# Patient Record
Sex: Male | Born: 1965 | Hispanic: Yes | Smoking: Former smoker
Health system: Southern US, Community
[De-identification: ages and names within clinical notes are randomized; demographics above are authoritative.]

## PROBLEM LIST (undated history)

## (undated) DIAGNOSIS — K859 Acute pancreatitis without necrosis or infection, unspecified: Secondary | ICD-10-CM

## (undated) DIAGNOSIS — Z720 Tobacco use: Secondary | ICD-10-CM

## (undated) DIAGNOSIS — E7800 Pure hypercholesterolemia, unspecified: Secondary | ICD-10-CM

## (undated) DIAGNOSIS — G459 Transient cerebral ischemic attack, unspecified: Secondary | ICD-10-CM

## (undated) HISTORY — PX: ABDOMINAL SURGERY: SHX537

## (undated) HISTORY — DX: Tobacco use: Z72.0

---

## 1996-09-04 HISTORY — PX: ABDOMINAL SURGERY: SHX537

## 2017-05-25 ENCOUNTER — Encounter (HOSPITAL_COMMUNITY): Payer: Self-pay | Admitting: *Deleted

## 2017-05-25 ENCOUNTER — Emergency Department (HOSPITAL_COMMUNITY): Payer: Self-pay

## 2017-05-25 ENCOUNTER — Observation Stay (HOSPITAL_COMMUNITY)
Admission: EM | Admit: 2017-05-25 | Discharge: 2017-05-27 | Disposition: A | Discharge status: Home / Self Care | Payer: Self-pay | Attending: Internal Medicine | Admitting: Internal Medicine

## 2017-05-25 DIAGNOSIS — R202 Paresthesia of skin: Secondary | ICD-10-CM | POA: Diagnosis present

## 2017-05-25 DIAGNOSIS — E876 Hypokalemia: Secondary | ICD-10-CM | POA: Insufficient documentation

## 2017-05-25 DIAGNOSIS — Z9114 Patient's other noncompliance with medication regimen: Secondary | ICD-10-CM

## 2017-05-25 DIAGNOSIS — G459 Transient cerebral ischemic attack, unspecified: Principal | ICD-10-CM | POA: Diagnosis present

## 2017-05-25 DIAGNOSIS — Z720 Tobacco use: Secondary | ICD-10-CM | POA: Diagnosis present

## 2017-05-25 DIAGNOSIS — Z8673 Personal history of transient ischemic attack (TIA), and cerebral infarction without residual deficits: Secondary | ICD-10-CM | POA: Insufficient documentation

## 2017-05-25 DIAGNOSIS — R079 Chest pain, unspecified: Secondary | ICD-10-CM | POA: Insufficient documentation

## 2017-05-25 DIAGNOSIS — G451 Carotid artery syndrome (hemispheric): Secondary | ICD-10-CM

## 2017-05-25 HISTORY — DX: Transient cerebral ischemic attack, unspecified: G45.9

## 2017-05-25 HISTORY — DX: Pure hypercholesterolemia, unspecified: E78.00

## 2017-05-25 HISTORY — DX: Acute pancreatitis without necrosis or infection, unspecified: K85.90

## 2017-05-25 LAB — DIFFERENTIAL
BASOS ABS: 0 10*3/uL (ref 0.0–0.1)
BASOS PCT: 0 %
Eosinophils Absolute: 0.4 10*3/uL (ref 0.0–0.7)
Eosinophils Relative: 5 %
LYMPHS PCT: 25 %
Lymphs Abs: 1.8 10*3/uL (ref 0.7–4.0)
MONO ABS: 0.6 10*3/uL (ref 0.1–1.0)
MONOS PCT: 9 %
Neutro Abs: 4.4 10*3/uL (ref 1.7–7.7)
Neutrophils Relative %: 61 %

## 2017-05-25 LAB — I-STAT TROPONIN, ED: TROPONIN I, POC: 0 ng/mL (ref 0.00–0.08)

## 2017-05-25 LAB — COMPREHENSIVE METABOLIC PANEL
ALK PHOS: 63 U/L (ref 38–126)
ALT: 26 U/L (ref 17–63)
AST: 22 U/L (ref 15–41)
Albumin: 4.4 g/dL (ref 3.5–5.0)
Anion gap: 10 (ref 5–15)
BUN: 12 mg/dL (ref 6–20)
CHLORIDE: 103 mmol/L (ref 101–111)
CO2: 25 mmol/L (ref 22–32)
CREATININE: 1.1 mg/dL (ref 0.61–1.24)
Calcium: 9 mg/dL (ref 8.9–10.3)
Glucose, Bld: 185 mg/dL — ABNORMAL HIGH (ref 65–99)
Potassium: 3.3 mmol/L — ABNORMAL LOW (ref 3.5–5.1)
Sodium: 138 mmol/L (ref 135–145)
Total Bilirubin: 0.4 mg/dL (ref 0.3–1.2)
Total Protein: 7.3 g/dL (ref 6.5–8.1)

## 2017-05-25 LAB — CBC
HEMATOCRIT: 42.6 % (ref 39.0–52.0)
HEMOGLOBIN: 15.4 g/dL (ref 13.0–17.0)
MCH: 32.6 pg (ref 26.0–34.0)
MCHC: 36.2 g/dL — ABNORMAL HIGH (ref 30.0–36.0)
MCV: 90.3 fL (ref 78.0–100.0)
Platelets: 184 10*3/uL (ref 150–400)
RBC: 4.72 MIL/uL (ref 4.22–5.81)
RDW: 12.3 % (ref 11.5–15.5)
WBC: 7.2 10*3/uL (ref 4.0–10.5)

## 2017-05-25 LAB — CBG MONITORING, ED: Glucose-Capillary: 186 mg/dL — ABNORMAL HIGH (ref 65–99)

## 2017-05-25 LAB — APTT: APTT: 26 s (ref 24–36)

## 2017-05-25 LAB — PROTIME-INR
INR: 0.92
Prothrombin Time: 12.3 seconds (ref 11.4–15.2)

## 2017-05-25 MED ORDER — ASPIRIN 81 MG PO CHEW
324.0000 mg | CHEWABLE_TABLET | Freq: Once | ORAL | Status: AC
  Administered 2017-05-26: 324 mg via ORAL
  Filled 2017-05-25: qty 4
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
## 2017-05-25 NOTE — ED Notes (Addendum)
Pt now reporting chest pain to the tele neurologist. Pt reporting pain in the upper left side of his chest.

## 2017-05-25 NOTE — ED Provider Notes (Signed)
[facility] DEPT Provider Note   CSN: [id number] Arrival date & time: 05/25/17  2323     History   Chief Complaint Chief Complaint  Patient presents with  . Code Stroke    HPI Nathaniel Dunlap is a 51 y.o. male.  The history is provided by the patient.  He has a history of a TIA last year. Yesterday, he had an episode of left-sided numbness and weakness which resolved after about 1.5 hours. Left-sided numbness and weakness recurred at about 6 PM, but is improving. Numbness involves face, arm, leg. He feels that he still has some numbness in his face, but weakness has improved. He has also had a left parietal headache. He was brought in by EMS as a possible code stroke.  No past medical history on file.  There are no active problems to display for this patient.   No past surgical history on file.     Home Medications    Prior to Admission medications   Not on File    Family History No family history on file.  Social History Social History  Substance Use Topics  . Smoking status: Not on file  . Smokeless tobacco: Not on file  . Alcohol use Not on file     Allergies   Patient has no allergy information on record.   Review of Systems Review of Systems  All other systems reviewed and are negative.    Physical Exam Updated Vital Signs BP (!) 158/92 (BP Location: Right Arm)   Pulse 83   Temp 99.3 F (37.4 C) (Oral)   Resp (!) 21   Ht 5\' 11"  (1.803 m)   Wt 84.4 kg (186 lb)   SpO2 96%   BMI 25.94 kg/m   Physical Exam  Nursing note and vitals reviewed.  51 year old male, resting comfortably and in no acute distress. Vital signs are significant for hypertension and borderline tachypnea. Oxygen saturation is 96%, which is normal. Head is normocephalic and atraumatic. PERRLA, EOMI. Oropharynx is clear. Neck is nontender and supple without adenopathy or JVD. There are no carotid bruits. Back is nontender and there is no CVA tenderness. Lungs are clear  without rales, wheezes, or rhonchi. Chest is nontender. Heart has regular rate and rhythm without murmur. Abdomen is soft, flat, nontender without masses or hepatosplenomegaly and peristalsis is normoactive. Extremities have no cyanosis or edema, full range of motion is present. Skin is warm and dry without rash. Neurologic: Mental status is normal, speech is normal, cranial nerves are significant for mild left central facial droop. There is no pronator drift. Grip strength, although flexion, elbow extension, hip flexion are 5/5 and symmetric. There is no extinction on double simultaneous stimulation.  ED Treatments / Results  Labs (all labs ordered are listed, but only abnormal results are displayed) Labs Reviewed  CBC - Abnormal; Notable for the following:       Result Value   MCHC 36.2 (*)    All other components within normal limits  COMPREHENSIVE METABOLIC PANEL - Abnormal; Notable for the following:    Potassium 3.3 (*)    Glucose, Bld 185 (*)    All other components within normal limits  URINALYSIS, ROUTINE W REFLEX MICROSCOPIC - Abnormal; Notable for the following:    Leukocytes, UA TRACE (*)    Bacteria, UA RARE (*)    Squamous Epithelial / LPF 0-5 (*)    All other components within normal limits  CBG MONITORING, ED - Abnormal; Notable for the  following:    Glucose-Capillary 186 (*)    All other components within normal limits  ETHANOL  PROTIME-INR  APTT  DIFFERENTIAL  RAPID URINE DRUG SCREEN, HOSP PERFORMED  I-STAT TROPONIN, ED    EKG  EKG Interpretation  Date/Time:  Friday May 25 2017 23:36:33 EDT Ventricular Rate:  83 PR Interval:    QRS Duration: 114 QT Interval:  361 QTC Calculation: 425 R Axis:   87 Text Interpretation:  Sinus rhythm Incomplete right bundle branch block ST elev, probable normal early repol pattern No old tracing to compare Confirmed by Delora Fuel ([id number]) on 05/25/2017 11:39:17 PM       Radiology Ct Head Code Stroke Wo  Contrast  Result Date: 05/25/2017 CLINICAL DATA:  Code stroke. LEFT-sided weakness, numbness and tingling. History of TIA. EXAM: CT HEAD WITHOUT CONTRAST TECHNIQUE: Contiguous axial images were obtained from the base of the skull through the vertex without intravenous contrast. COMPARISON:  None. FINDINGS: BRAIN: No intraparenchymal hemorrhage, mass effect nor midline shift. The ventricles and sulci are normal. No acute large vascular territory infarcts. No abnormal extra-axial fluid collections. Basal cisterns are patent. VASCULAR: Trace calcific atherosclerosis carotid siphons. SKULL/SOFT TISSUES: No skull fracture. No significant soft tissue swelling. ORBITS/SINUSES: The included ocular globes and orbital contents are normal.Trace paranasal sinus mucosal thickening. Mastoid air cells are well aerated. OTHER: None. ASPECTS [facility] Stroke Program Early CT Score) - Ganglionic level infarction (caudate, lentiform nuclei, internal capsule, insula, M1-M3 cortex): 7 - Supraganglionic infarction (M4-M6 cortex): 3 Total score (0-10 with 10 being normal): 10 IMPRESSION: 1. Negative CT HEAD for age. 2. ASPECTS is 10. 3. Critical Value/emergent results were called by telephone at the time of interpretation on 05/25/2017 at 11:37 pm to Dr. Delora Fuel , who verbally acknowledged these results. Electronically Signed   By: Elon Alas M.D.   On: 05/25/2017 23:38    Procedures Procedures (including critical care time) CRITICAL CARE Performed by: NLZJQ,BHALP Total critical care time: 45 minutes Critical care time was exclusive of separately billable procedures and treating other patients. Critical care was necessary to treat or prevent imminent or life-threatening deterioration. Critical care was time spent personally by me on the following activities: development of treatment plan with patient and/or surrogate as well as nursing, discussions with consultants, evaluation of patient's response to treatment,  examination of patient, obtaining history from patient or surrogate, ordering and performing treatments and interventions, ordering and review of laboratory studies, ordering and review of radiographic studies, pulse oximetry and re-evaluation of patient's condition.  Medications Ordered in ED Medications - No data to display   Initial Impression / Assessment and Plan / ED Course  I have reviewed the triage vital signs and the nursing notes.  Pertinent labs & imaging results that were available during my care of the patient were reviewed by me and considered in my medical decision making (see chart for details).  TIA versus small stroke. He is outside the treatment window for thrombolytic therapy, and has deficits which are too mild to warrant emergent perfusion studies. His prior TIA workup was in [place], and I do not have access to those records. He has no prior records and the [facility] system.  CT shows small vessel disease, no evidence of acute stroke. Tele-Neurology consult appreciated-they also note mildly asymmetric smile but no definite findings of acute stroke. There are also in agreement that he is outside the window for thrombolytic care and does not exhibit serious enough deficit to warrant perfusion studies  for possible interventional radiology management. He will need to be admitted for TIA evaluation. He is started on aspirin. Of note, he admits to not taking aspirin and medication for his cholesterol and also continues to smoke 1 pack of cigarettes a day. Importance of medication compliance and stopping smoking was stressed. Case is discussed with Dr. Darrick Meigs of [facility] who agrees to admit the patient.  Final Clinical Impressions(s) / ED Diagnoses   Final diagnoses:  Hemispheric carotid artery syndrome  Noncompliance with medication regimen  Tobacco abuse    New Prescriptions New Prescriptions   No medications on file     Delora Fuel, MD 30/14/84 [phone]

## 2017-05-25 NOTE — Progress Notes (Signed)
Er called with patient en router to er @ 2311, patient arrived @ 2326, exam finished and sent to soc at 2328, exam completed and called radiologist at 2330

## 2017-05-26 ENCOUNTER — Observation Stay (HOSPITAL_COMMUNITY): Payer: Self-pay

## 2017-05-26 ENCOUNTER — Encounter (HOSPITAL_COMMUNITY): Payer: Self-pay | Admitting: *Deleted

## 2017-05-26 ENCOUNTER — Observation Stay (HOSPITAL_BASED_OUTPATIENT_CLINIC_OR_DEPARTMENT_OTHER): Payer: Self-pay

## 2017-05-26 DIAGNOSIS — G451 Carotid artery syndrome (hemispheric): Secondary | ICD-10-CM

## 2017-05-26 DIAGNOSIS — G459 Transient cerebral ischemic attack, unspecified: Secondary | ICD-10-CM

## 2017-05-26 DIAGNOSIS — R531 Weakness: Secondary | ICD-10-CM

## 2017-05-26 DIAGNOSIS — Z9114 Patient's other noncompliance with medication regimen: Secondary | ICD-10-CM

## 2017-05-26 LAB — URINALYSIS, ROUTINE W REFLEX MICROSCOPIC
Bilirubin Urine: NEGATIVE
GLUCOSE, UA: NEGATIVE mg/dL
HGB URINE DIPSTICK: NEGATIVE
Ketones, ur: NEGATIVE mg/dL
Nitrite: NEGATIVE
Protein, ur: NEGATIVE mg/dL
SPECIFIC GRAVITY, URINE: 1.011 (ref 1.005–1.030)
pH: 6 (ref 5.0–8.0)

## 2017-05-26 LAB — LIPID PANEL
Cholesterol: 193 mg/dL (ref 0–200)
HDL: 24 mg/dL — ABNORMAL LOW (ref >40)
LDL Cholesterol: UNDETERMINED mg/dL (ref 0–99)
Total CHOL/HDL Ratio: 8 RATIO
Triglycerides: 489 mg/dL — ABNORMAL HIGH (ref <150)
VLDL: UNDETERMINED mg/dL (ref 0–40)

## 2017-05-26 LAB — ECHOCARDIOGRAM COMPLETE
Height: 71 in
Weight: 3022.95 oz

## 2017-05-26 LAB — RAPID URINE DRUG SCREEN, HOSP PERFORMED
AMPHETAMINES: NOT DETECTED
BARBITURATES: NOT DETECTED
BENZODIAZEPINES: NOT DETECTED
Cocaine: NOT DETECTED
Opiates: NOT DETECTED
Tetrahydrocannabinol: NOT DETECTED

## 2017-05-26 LAB — HEMOGLOBIN A1C
Hgb A1c MFr Bld: 6 % — ABNORMAL HIGH (ref 4.8–5.6)
Mean Plasma Glucose: 125.5 mg/dL

## 2017-05-26 LAB — ETHANOL: Alcohol, Ethyl (B): <5 mg/dL (ref <5)

## 2017-05-26 MED ORDER — ACETAMINOPHEN 160 MG/5ML PO SOLN: 650.0000 mg | ORAL | Status: DC | PRN

## 2017-05-26 MED ORDER — POTASSIUM CHLORIDE CRYS ER 20 MEQ PO TBCR
40.0000 meq | EXTENDED_RELEASE_TABLET | Freq: Once | ORAL | Status: AC
  Administered 2017-05-26: 40 meq via ORAL
  Filled 2017-05-26: qty 2

## 2017-05-26 MED ORDER — SODIUM CHLORIDE 0.9 % IV SOLN
INTRAVENOUS | Status: DC
  Administered 2017-05-26: 04:00:00 via INTRAVENOUS

## 2017-05-26 MED ORDER — ASPIRIN 300 MG RE SUPP
300.0000 mg | Freq: Every day | RECTAL | Status: DC
  Filled 2017-05-26: qty 1

## 2017-05-26 MED ORDER — ACETAMINOPHEN 325 MG PO TABS: 650.0000 mg | ORAL_TABLET | ORAL | Status: DC | PRN

## 2017-05-26 MED ORDER — ACETAMINOPHEN 650 MG RE SUPP: 650.0000 mg | RECTAL | Status: DC | PRN

## 2017-05-26 MED ORDER — SENNOSIDES-DOCUSATE SODIUM 8.6-50 MG PO TABS: 1.0000 | ORAL_TABLET | Freq: Every evening | ORAL | Status: DC | PRN

## 2017-05-26 MED ORDER — ENOXAPARIN SODIUM 40 MG/0.4ML ~~LOC~~ SOLN
40.0000 mg | SUBCUTANEOUS | Status: DC
  Administered 2017-05-26 – 2017-05-27 (×2): 40 mg via SUBCUTANEOUS
  Filled 2017-05-26 (×2): qty 0.4

## 2017-05-26 MED ORDER — ASPIRIN 325 MG PO TABS
325.0000 mg | ORAL_TABLET | Freq: Every day | ORAL | Status: DC
  Administered 2017-05-26 – 2017-05-27 (×2): 325 mg via ORAL
  Filled 2017-05-26 (×2): qty 1

## 2017-05-26 MED ORDER — STROKE: EARLY STAGES OF RECOVERY BOOK
Freq: Once | Status: DC
  Filled 2017-05-26: qty 1
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
## 2017-05-26 NOTE — Progress Notes (Signed)
Pt arrived back on the floor from MRI.

## 2017-05-26 NOTE — Progress Notes (Signed)
Stroke education booklet given to pt.

## 2017-05-26 NOTE — Progress Notes (Signed)
Spoke with MD, verbal order to discontinue stroke neuro checks and vitals. Will continue to monitor.

## 2017-05-26 NOTE — ED Notes (Signed)
Pt reports decrease in headache, decrease in numbness and tingling in his left cheek and left arm. Pt denying chest pain at this time. Pt still has small amount of left sided facial drop but is improved from when he first arrived.

## 2017-05-26 NOTE — Evaluation (Signed)
Physical Therapy Evaluation Patient Details Name: Nathaniel Dunlap MRN: [id number] DOB: 1965-09-05 Today's Date: 05/26/2017   History of Present Illness  Nathaniel Dunlap  is a 51 y.o. male, With history of TIA in the past came to hospital with episode of chest pain with left-sided numbness of face and left upper and lower extremity, which lasted for about 1.5 hours. Patient says that he had similar episode yesterday which resolved very quickly. He also complains of chest pain, though he is chest pain-free at this time.  Clinical Impression  Pt has no weakness, balance or ambulation difficulty.  Pt does not need PT services    Follow Up Recommendations No PT follow up    Equipment Recommendations  None recommended by PT    Recommendations for Other Services       Precautions / Restrictions Precautions Precautions: None Restrictions Weight Bearing Restrictions: No      Mobility  Bed Mobility Overal bed mobility: Independent                Transfers Overall transfer level: Independent                  Ambulation/Gait Ambulation/Gait assistance: Independent   Assistive device: None Gait Pattern/deviations: WFL(Within Functional Limits)   Gait velocity interpretation: at or above normal speed for age/gender    Stairs            Wheelchair Mobility    Modified Rankin (Stroke Patients Only)       Balance Overall balance assessment: Independent                                           Pertinent Vitals/Pain Pain Assessment: No/denies pain    Home Living Family/patient expects to be discharged to:: Private residence Living Arrangements: Spouse/significant other;Children Available Help at Discharge: Family Type of Home: House Home Access: Stairs to enter Entrance Stairs-Rails: Right Entrance Stairs-Number of Steps: 5 Home Layout: One level Home Equipment: None      Prior Function Level of Independence: Independent                Hand Dominance        Extremity/Trunk Assessment        Lower Extremity Assessment Lower Extremity Assessment: Overall WFL for tasks assessed       Communication   Communication: No difficulties  Cognition Arousal/Alertness: Awake/alert Behavior During Therapy: WFL for tasks assessed/performed Overall Cognitive Status: Within Functional Limits for tasks assessed                                        General Comments      Exercises     Assessment/Plan    PT Assessment Patent does not need any further PT services  PT Problem List         PT Treatment Interventions      PT Goals (Current goals can be found in the Care Plan section)       Frequency     Barriers to discharge        Co-evaluation               AM-PAC PT "6 Clicks" Daily Activity  Outcome Measure Difficulty turning over in bed (including adjusting bedclothes, sheets and blankets)?: None Difficulty moving  from lying on back to sitting on the side of the bed? : None Difficulty sitting down on and standing up from a chair with arms (e.g., wheelchair, bedside commode, etc,.)?: None Help needed moving to and from a bed to chair (including a wheelchair)?: None Help needed walking in hospital room?: None Help needed climbing 3-5 steps with a railing? : None 6 Click Score: 24    End of Session Equipment Utilized During Treatment: Gait belt Activity Tolerance: Patient tolerated treatment well Patient left: in bed Nurse Communication: Mobility status PT Visit Diagnosis: Unsteadiness on feet (R26.81)    Time: 5537-4827 PT Time Calculation (min) (ACUTE ONLY): 28 min   Charges:         PT G Codes:   PT G-Codes **NOT FOR INPATIENT CLASS** Functional Assessment Tool Used: AM-PAC 6 Clicks Basic Mobility Functional Limitation: Mobility: Walking and moving around Mobility: Walking and Moving Around Current Status (M7867): 0 percent impaired, limited or  restricted Mobility: Walking and Moving Around Goal Status (J4492): 0 percent impaired, limited or restricted Mobility: Walking and Moving Around Discharge Status (E1007): 0 percent impaired, limited or restricted      05/26/2017, 11:39 AM

## 2017-05-26 NOTE — Progress Notes (Signed)
Patient admitted to the hospital earlier this morning by Dr. Darrick Meigs  Patient seen and examined. He had transient numbness of his arm and leg as well as his face. He's had 2 episodes in the past 2 days, each lasting 1-2 hours. His similar episode earlier this year. He also complains of left-sided neck pain that radiates down his chest. Question if he has cervical radiculopathy. MRI of the brain/MRA of the head was unremarkable. Carotid Dopplers and echocardiogram were also unrevealing. We'll perform MRI of the C-spine for further evaluation.  Nasiah Lehenbauer

## 2017-05-26 NOTE — Progress Notes (Signed)
Pt being transported down to MRI by transport staff.

## 2017-05-26 NOTE — ED Notes (Signed)
Pt sleeping at this time. Pt being transported upstairs.

## 2017-05-26 NOTE — Progress Notes (Signed)
  Echocardiogram 2D Echocardiogram has been performed.  Tresa Res 05/26/2017, 11:09 AM

## 2017-05-27 ENCOUNTER — Observation Stay (HOSPITAL_COMMUNITY): Payer: Self-pay

## 2017-05-27 DIAGNOSIS — Z720 Tobacco use: Secondary | ICD-10-CM | POA: Diagnosis present

## 2017-05-27 DIAGNOSIS — R202 Paresthesia of skin: Secondary | ICD-10-CM | POA: Diagnosis present

## 2017-05-27 LAB — HIV ANTIBODY (ROUTINE TESTING W REFLEX): HIV Screen 4th Generation wRfx: NONREACTIVE

## 2017-05-27 NOTE — Progress Notes (Signed)
Pr back on floor from MRI.

## 2017-05-27 NOTE — Discharge Summary (Signed)
Physician Discharge Summary  Nathaniel Dunlap GEZ:[id number] DOB: [birth year] DOA: 05/25/2017  PCP: Patient, No Pcp Per  Admit date: 05/25/2017 Discharge date: 05/27/2017  Admitted From:Home Disposition:  Home  Recommendations for Outpatient Follow-up:  1. Follow up with PCP in 1-2 weeks 2. Please obtain BMP/CBC in one week 3. Patient has been referred for outpatient neurology. Will likely benefit from outpatient EEG 4. No driving until cleared by neurology 5. He has also been referred to neurosurgery to evaluate bilateral cervical neuroforaminal stenosis  Home Health: Equipment/Devices:  Discharge Condition: Stable CODE STATUS: Full code Diet recommendation: Heart Healthy  Brief/Interim Summary: 51 -year-old male who is admitted to the hospital with transient numbness and paresthesias of his left face, arm and leg. He reported symptoms lasted approximately 1-2 hours. This occurred on the day of admission. He also had a similar episode on the day before admission, lasting 1-2 hours and resolving spontaneously. He does not have any symptoms at this time. MRI of the brain/MRA of the head/carotid Doppler/echocardiogram were all unrevealing. Since he also had neck pain radiating down his chest with the symptoms, MRI of the C-spine was also obtained. This did show nonspecific patchy marrow edema in the C5, C6, and C7 vertebral bodies. No suspicious signal changes in the intervening discs and no associated epidural or paraspinal inflammation. There was also mention of C5-C6 and C6-C7 disc and endplate degeneration. No cervical spinal stenosis, moderate to severe right greater than left C6 and left C7 neural foraminal stenosis. Images were reviewed with neurosurgery who felt that foraminal stenosis may be significant, leading to neck pain, but likely not causing his numbness and paresthesias. They have recommended that the patient follow-up with neurosurgery to discuss further management of stenosis. Case  was also reviewed with on-call neurology who did not feel the MRI findings with significant. They did recommend outpatient neurology follow-up further investigation regarding transient paresthesia/numbness. Patient may benefit from outpatient EEG to rule out any partial seizures. He is advised that he cannot drive until he is cleared by neurology. Outpatient referral for neurology has been made. Patient is otherwise stable for discharge home today.  Discharge Diagnoses:  Principal Problem:   Paresthesia of left arm and leg Active Problems:   Tobacco use    Discharge Instructions  Discharge Instructions    Ambulatory referral to Neurology    Complete by:  As directed    An appointment is requested in approximately: 1 week Patient does not have any preference of provider. Please schedule with next available provider   Diet - low sodium heart healthy    Complete by:  As directed    Driving Restrictions    Complete by:  As directed    No driving until cleared by neurology   Increase activity slowly    Complete by:  As directed      Allergies as of 05/27/2017   No Known Allergies     Medication List    TAKE these medications   aspirin 81 MG chewable tablet Chew 81 mg by mouth daily.            Discharge Care Instructions        Start     Ordered   05/27/17 0000  Ambulatory referral to Neurology    Comments:  An appointment is requested in approximately: 1 week Patient does not have any preference of provider. Please schedule with next available provider   05/27/17 1551   05/27/17 0000  Increase activity slowly  05/27/17 1551   05/27/17 0000  Diet - low sodium heart healthy     05/27/17 1551   05/27/17 0000  Driving Restrictions    Comments:  No driving until cleared by neurology   05/27/17 1551      No Known Allergies  Consultations:     Procedures/Studies: Dg Chest 2 View  Result Date: 05/26/2017 CLINICAL DATA:  Recent TIAs EXAM: CHEST  2 VIEW  COMPARISON:  None. FINDINGS: Cardiac shadow is within normal limits. The lungs are well aerated bilaterally with mild bronchitic markings. No focal confluent infiltrate is seen. No sizable effusion is noted. No bony abnormality is seen. IMPRESSION: Mild bronchitic changes without acute focal infiltrate. Electronically Signed   By: Inez Catalina M.D.   On: 05/26/2017 10:04   Mr Brain Wo Contrast  Result Date: 05/26/2017 CLINICAL DATA:  51 year old male code stroke presentation, left side weakness numbness and tingling. Initial encounter. EXAM: MRI HEAD WITHOUT CONTRAST MRA HEAD WITHOUT CONTRAST TECHNIQUE: Multiplanar, multiecho pulse sequences of the brain and surrounding structures were obtained without intravenous contrast. Angiographic images of the head were obtained using MRA technique without contrast. COMPARISON:  CT head without contrast 05/25/2017. FINDINGS: MRI HEAD FINDINGS Brain: No restricted diffusion to suggest acute infarction. No midline shift, mass effect, evidence of mass lesion, ventriculomegaly, extra-axial collection or acute intracranial hemorrhage. Cervicomedullary junction and pituitary are within normal limits. Pearline Cables and white matter signal is within normal limits throughout the brain. No encephalomalacia or chronic cerebral blood products identified. Vascular: Major intracranial vascular flow voids are preserved. Skull and upper cervical spine: Negative. Visualized bone marrow signal is within normal limits. Sinuses/Orbits: Normal orbits soft tissues. Mild maxillary sinus mucosal thickening. Small volume retained secretions in the right nasal cavity and nasopharynx. Incidental midline proteinaceous Tornwaldt cyst (normal variant). Other: Visible internal auditory structures appear normal. Mastoids are clear. Scalp and face soft tissues appear negative. MRA HEAD FINDINGS Antegrade flow in the posterior circulation. Mildly dominant distal left vertebral artery. Patent PICA origins. No  distal vertebral stenosis. Patent vertebrobasilar junction and basilar artery without stenosis. AICA and SCA origins are patent. Normal PCA origins. Normal right posterior communicating artery, the left is diminutive or absent. Bilateral PCA branches are normal. Antegrade flow in both ICA siphons. Mild siphon irregularity. No siphon stenosis. Normal ophthalmic and right posterior communicating artery origins. Normal MCA and ACA origins. Visible ACA branches are within normal limits. Both MCA M1 segments and MCA bifurcations are patent and appear normal. Visible left and right MCA branches are within normal limits. IMPRESSION: 1. Normal noncontrast MRI appearance of the brain. 2.  Negative intracranial MRA. Electronically Signed   By: Genevie Ann M.D.   On: 05/26/2017 11:13   Mr Cervical Spine Wo Contrast  Result Date: 05/27/2017 CLINICAL DATA:  51 year old male with cervical neck pain, left side numbness and weakness. Code stroke presentation 2 days ago. No known injury. EXAM: MRI CERVICAL SPINE WITHOUT CONTRAST TECHNIQUE: Multiplanar, multisequence MR imaging of the cervical spine was performed. No intravenous contrast was administered. COMPARISON:  Brain MRI and intracranial MRA 05/26/2017. FINDINGS: Study is intermittently degraded by motion artifact despite repeated imaging attempts. Alignment: Preserved cervical lordosis.  No spondylolisthesis. Vertebrae: Patchy and confluent vertebral body marrow edema in the C5, C6, and C7 bodies. No posterior element involvement. The intervening disc spaces appear relatively normal. No associated prevertebral or paraspinal inflammation. Elsewhere bone marrow signal is within normal limits. No other acute osseous abnormality identified. Cord: Detail of the spinal cord is  degraded at some levels but there is no definite cord signal abnormality. No cord expansion. Posterior Fossa, vertebral arteries, paraspinal tissues: Stable and negative visualized brain parenchyma. Preserved  major vascular flow voids in the neck. No paraspinal or other neck soft tissue inflammation identified. Disc levels: C2-C3:  Mild facet hypertrophy. C3-C4:  Minimal disc bulge and endplate spurring.  No stenosis. C4-C5:  Negative disc.  Minimal endplate spurring.  No stenosis. C5-C6: Mild disc space loss. Mild circumferential disc bulge and endplate spurring. No spinal stenosis. Right greater than left foraminal disc and/or endplate involvement. Moderate to severe left and severe right C6 neural foraminal stenosis. C6-C7: Mild to moderate disc space loss with disc desiccation. Circumferential disc bulge and endplate spurring. No spinal stenosis. Left greater than right foraminal disc and/or endplate involvement. Moderate to severe left and mild right C7 neural foraminal stenosis. C7-T1:  Mild facet hypertrophy. No upper thoracic spinal or foraminal stenosis. IMPRESSION: 1. Nonspecific patchy marrow edema in the C5, C6, and C7 vertebral bodies. No suspicious signal changes in the intervening discs, and no associated epidural or paraspinal inflammation. Burtis Junes therefore this is degenerative in nature (see #2), but consider screening the patient for bacteremia - especially if there are other clinical signs/symptoms of infection. 2. C5-C6 and C6-C7 disc and endplate degeneration. No cervical spinal stenosis, but moderate to severe right greater than left C6 and left C7 neural foraminal stenosis. Electronically Signed   By: Genevie Ann M.D.   On: 05/27/2017 10:39   US Carotid Bilateral (at [facility] Only)  Result Date: 05/26/2017 CLINICAL DATA:  TIA with left-sided numbness and weakness. Headache and left-sided neck pain. Tobacco use. EXAM: BILATERAL CAROTID DUPLEX ULTRASOUND TECHNIQUE: Pearline Cables scale imaging, color Doppler and duplex ultrasound were performed of bilateral carotid and vertebral arteries in the neck. COMPARISON:  None. FINDINGS: Criteria: Quantification of carotid stenosis is based on velocity parameters  that correlate the residual internal carotid diameter with NASCET-based stenosis levels, using the diameter of the distal internal carotid lumen as the denominator for stenosis measurement. The following velocity measurements were obtained: RIGHT ICA:  86/37 cm/sec CCA:  174/08 cm/sec SYSTOLIC ICA/CCA RATIO:  0.7 DIASTOLIC ICA/CCA RATIO:  1.1 ECA:  126 cm/sec LEFT ICA:  90/35 cm/sec CCA:  14/48 cm/sec SYSTOLIC ICA/CCA RATIO:  0.9 DIASTOLIC ICA/CCA RATIO:  1.6 ECA:  128 cm/sec RIGHT CAROTID ARTERY: No evidence of focal plaque. Velocities and waveforms are normal. No evidence of right-sided carotid stenosis. RIGHT VERTEBRAL ARTERY: Antegrade flow with normal waveform and velocity. LEFT CAROTID ARTERY: No evidence of focal plaque. Velocities and waveforms are within normal limits. No evidence of left-sided carotid stenosis. LEFT VERTEBRAL ARTERY: Antegrade flow with normal waveform and velocity. IMPRESSION: Normal carotid duplex ultrasound demonstrating no evidence of atherosclerotic plaque or carotid stenosis in the neck bilaterally. Electronically Signed   By: Aletta Edouard M.D.   On: 05/26/2017 11:33   Mr Jodene Nam Head/brain JE Cm  Result Date: 05/26/2017 CLINICAL DATA:  51 year old male code stroke presentation, left side weakness numbness and tingling. Initial encounter. EXAM: MRI HEAD WITHOUT CONTRAST MRA HEAD WITHOUT CONTRAST TECHNIQUE: Multiplanar, multiecho pulse sequences of the brain and surrounding structures were obtained without intravenous contrast. Angiographic images of the head were obtained using MRA technique without contrast. COMPARISON:  CT head without contrast 05/25/2017. FINDINGS: MRI HEAD FINDINGS Brain: No restricted diffusion to suggest acute infarction. No midline shift, mass effect, evidence of mass lesion, ventriculomegaly, extra-axial collection or acute intracranial hemorrhage. Cervicomedullary junction and pituitary are  within normal limits. Pearline Cables and white matter signal is within  normal limits throughout the brain. No encephalomalacia or chronic cerebral blood products identified. Vascular: Major intracranial vascular flow voids are preserved. Skull and upper cervical spine: Negative. Visualized bone marrow signal is within normal limits. Sinuses/Orbits: Normal orbits soft tissues. Mild maxillary sinus mucosal thickening. Small volume retained secretions in the right nasal cavity and nasopharynx. Incidental midline proteinaceous Tornwaldt cyst (normal variant). Other: Visible internal auditory structures appear normal. Mastoids are clear. Scalp and face soft tissues appear negative. MRA HEAD FINDINGS Antegrade flow in the posterior circulation. Mildly dominant distal left vertebral artery. Patent PICA origins. No distal vertebral stenosis. Patent vertebrobasilar junction and basilar artery without stenosis. AICA and SCA origins are patent. Normal PCA origins. Normal right posterior communicating artery, the left is diminutive or absent. Bilateral PCA branches are normal. Antegrade flow in both ICA siphons. Mild siphon irregularity. No siphon stenosis. Normal ophthalmic and right posterior communicating artery origins. Normal MCA and ACA origins. Visible ACA branches are within normal limits. Both MCA M1 segments and MCA bifurcations are patent and appear normal. Visible left and right MCA branches are within normal limits. IMPRESSION: 1. Normal noncontrast MRI appearance of the brain. 2.  Negative intracranial MRA. Electronically Signed   By: Genevie Ann M.D.   On: 05/26/2017 11:13   Ct Head Code Stroke Wo Contrast  Result Date: 05/25/2017 CLINICAL DATA:  Code stroke. LEFT-sided weakness, numbness and tingling. History of TIA. EXAM: CT HEAD WITHOUT CONTRAST TECHNIQUE: Contiguous axial images were obtained from the base of the skull through the vertex without intravenous contrast. COMPARISON:  None. FINDINGS: BRAIN: No intraparenchymal hemorrhage, mass effect nor midline shift. The ventricles  and sulci are normal. No acute large vascular territory infarcts. No abnormal extra-axial fluid collections. Basal cisterns are patent. VASCULAR: Trace calcific atherosclerosis carotid siphons. SKULL/SOFT TISSUES: No skull fracture. No significant soft tissue swelling. ORBITS/SINUSES: The included ocular globes and orbital contents are normal.Trace paranasal sinus mucosal thickening. Mastoid air cells are well aerated. OTHER: None. ASPECTS [facility] Stroke Program Early CT Score) - Ganglionic level infarction (caudate, lentiform nuclei, internal capsule, insula, M1-M3 cortex): 7 - Supraganglionic infarction (M4-M6 cortex): 3 Total score (0-10 with 10 being normal): 10 IMPRESSION: 1. Negative CT HEAD for age. 2. ASPECTS is 10. 3. Critical Value/emergent results were called by telephone at the time of interpretation on 05/25/2017 at 11:37 pm to Dr. Delora Fuel , who verbally acknowledged these results. Electronically Signed   By: Elon Alas M.D.   On: 05/25/2017 23:38    Echo:- Mild LVH with LVEF 60-65% and indeterminate diastolic function.   Trivial mitral regurgitation. Trivial tricuspid regurgitation. No   obvious PFO or ASD.   Subjective: No further numbness or paresthesia.  Discharge Exam: Vitals:   05/26/17 2000 05/27/17 0539  BP: 114/64 114/76  Pulse: 77 69  Resp: 16 16  Temp: 98.7 F (37.1 C) 97.9 F (36.6 C)  SpO2: 97% 96%   Vitals:   05/26/17 1407 05/26/17 1534 05/26/17 2000 05/27/17 0539  BP:  130/85 114/64 114/76  Pulse:   77 69  Resp:  16 16 16   Temp:  98.3 F (36.8 C) 98.7 F (37.1 C) 97.9 F (36.6 C)  TempSrc:  Oral Oral Oral  SpO2: 94% 97% 97% 96%  Weight:      Height:        General: Pt is alert, awake, not in acute distress Cardiovascular: RRR, S1/S2 +, no rubs, no gallops Respiratory: CTA  bilaterally, no wheezing, no rhonchi Abdominal: Soft, NT, ND, bowel sounds + Extremities: no edema, no cyanosis    The results of significant diagnostics from this  hospitalization (including imaging, microbiology, ancillary and laboratory) are listed below for reference.     Microbiology: No results found for this or any previous visit (from the past 240 hour(s)).   Labs: BNP (last 3 results) No results for input(s): BNP in the last 8760 hours. Basic Metabolic Panel:  Recent Labs Lab 05/25/17 2326  NA 138  K 3.3*  CL 103  CO2 25  GLUCOSE 185*  BUN 12  CREATININE 1.10  CALCIUM 9.0   Liver Function Tests:  Recent Labs Lab 05/25/17 2326  AST 22  ALT 26  ALKPHOS 63  BILITOT 0.4  PROT 7.3  ALBUMIN 4.4   No results for input(s): LIPASE, AMYLASE in the last 168 hours. No results for input(s): AMMONIA in the last 168 hours. CBC:  Recent Labs Lab 05/25/17 2326  WBC 7.2  NEUTROABS 4.4  HGB 15.4  HCT 42.6  MCV 90.3  PLT 184   Cardiac Enzymes: No results for input(s): CKTOTAL, CKMB, CKMBINDEX, TROPONINI in the last 168 hours. BNP: Invalid input(s): POCBNP CBG:  Recent Labs Lab 05/25/17 2347  GLUCAP 186*   D-Dimer No results for input(s): DDIMER in the last 72 hours. Hgb A1c  Recent Labs  05/26/17 0620  HGBA1C 6.0*   Lipid Profile  Recent Labs  05/26/17 0620  CHOL 193  HDL 24*  LDLCALC UNABLE TO CALCULATE IF TRIGLYCERIDE OVER 400 mg/dL  TRIG 489*  CHOLHDL 8.0   Thyroid function studies No results for input(s): TSH, T4TOTAL, T3FREE, THYROIDAB in the last 72 hours.  Invalid input(s): FREET3 Anemia work up No results for input(s): VITAMINB12, FOLATE, FERRITIN, TIBC, IRON, RETICCTPCT in the last 72 hours. Urinalysis    Component Value Date/Time   COLORURINE YELLOW 05/25/2017 2325   APPEARANCEUR CLEAR 05/25/2017 2325   LABSPEC 1.011 05/25/2017 2325   PHURINE 6.0 05/25/2017 2325   GLUCOSEU NEGATIVE 05/25/2017 2325   HGBUR NEGATIVE 05/25/2017 2325   BILIRUBINUR NEGATIVE 05/25/2017 2325   KETONESUR NEGATIVE 05/25/2017 2325   PROTEINUR NEGATIVE 05/25/2017 2325   NITRITE NEGATIVE 05/25/2017 2325    LEUKOCYTESUR TRACE (A) 05/25/2017 2325   Sepsis Labs Invalid input(s): PROCALCITONIN,  WBC,  LACTICIDVEN Microbiology No results found for this or any previous visit (from the past 240 hour(s)).   Time coordinating discharge: Over 30 minutes  SIGNED:   Kathie Dike, MD  [facility] 05/27/2017, 6:43 PM Pager   If 7PM-7AM, please contact night-coverage [email or web address] Password [email or web address]

## 2017-05-27 NOTE — Progress Notes (Signed)
IV's removed, WNL. D/C instructions given to pt and family. Verbalized understanding. Pt ambulatory, family at bedside to transport home.

## 2017-05-27 NOTE — Progress Notes (Signed)
Pt transported down to radiology via transport staff.
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
## 2017-06-08 ENCOUNTER — Ambulatory Visit (INDEPENDENT_AMBULATORY_CARE_PROVIDER_SITE_OTHER): Payer: Self-pay | Admitting: Neurology

## 2017-06-08 ENCOUNTER — Encounter: Payer: Self-pay | Admitting: Neurology

## 2017-06-08 VITALS — BP 124/72 | HR 79 | Ht 71.0 in | Wt 190.0 lb

## 2017-06-08 DIAGNOSIS — E782 Mixed hyperlipidemia: Secondary | ICD-10-CM

## 2017-06-08 DIAGNOSIS — M542 Cervicalgia: Secondary | ICD-10-CM

## 2017-06-08 DIAGNOSIS — G43109 Migraine with aura, not intractable, without status migrainosus: Secondary | ICD-10-CM

## 2017-06-08 DIAGNOSIS — Z720 Tobacco use: Secondary | ICD-10-CM

## 2017-06-08 MED ORDER — CYCLOBENZAPRINE HCL 5 MG PO TABS: 5.0000 mg | ORAL_TABLET | Freq: Two times a day (BID) | ORAL | 1 refills | Status: DC | PRN

## 2017-06-08 MED ORDER — SIMVASTATIN 20 MG PO TABS: 20.0000 mg | ORAL_TABLET | Freq: Every day | ORAL | 5 refills | Status: DC

## 2017-06-08 NOTE — Patient Instructions (Addendum)
Start flexeril 5mg  twice daily as needed for neck pain and discomfort.  DO NOT TAKE WHEN DRIVING  Start simvastatin 20mg  daily  If you develop headache, take Aleve or ibuprofen for your pain  Limit all tylenol, ibuprofen, and Aleve to twice per week  You are cleared to resume driving  If you develop any new symptom, we may consider EEG testing  Stop smoking  Establish care with a primary care doctor  Return to clinic in 4 months

## 2017-10-10 ENCOUNTER — Ambulatory Visit: Payer: Self-pay | Admitting: Neurology

## 2018-04-24 ENCOUNTER — Other Ambulatory Visit: Payer: Self-pay

## 2018-04-24 ENCOUNTER — Emergency Department (HOSPITAL_COMMUNITY): Payer: No Typology Code available for payment source

## 2018-04-24 ENCOUNTER — Emergency Department (HOSPITAL_COMMUNITY)
Admission: EM | Admit: 2018-04-24 | Discharge: 2018-04-24 | Disposition: A | Discharge status: Home / Self Care | Payer: No Typology Code available for payment source | Attending: Emergency Medicine | Admitting: Emergency Medicine

## 2018-04-24 ENCOUNTER — Encounter (HOSPITAL_COMMUNITY): Payer: Self-pay | Admitting: Emergency Medicine

## 2018-04-24 DIAGNOSIS — Z7982 Long term (current) use of aspirin: Secondary | ICD-10-CM | POA: Insufficient documentation

## 2018-04-24 DIAGNOSIS — Z87891 Personal history of nicotine dependence: Secondary | ICD-10-CM | POA: Insufficient documentation

## 2018-04-24 DIAGNOSIS — R072 Precordial pain: Secondary | ICD-10-CM

## 2018-04-24 DIAGNOSIS — S39012A Strain of muscle, fascia and tendon of lower back, initial encounter: Secondary | ICD-10-CM

## 2018-04-24 DIAGNOSIS — S161XXA Strain of muscle, fascia and tendon at neck level, initial encounter: Secondary | ICD-10-CM | POA: Insufficient documentation

## 2018-04-24 DIAGNOSIS — Z79899 Other long term (current) drug therapy: Secondary | ICD-10-CM | POA: Diagnosis not present

## 2018-04-24 DIAGNOSIS — Y92410 Unspecified street and highway as the place of occurrence of the external cause: Secondary | ICD-10-CM | POA: Insufficient documentation

## 2018-04-24 DIAGNOSIS — Y999 Unspecified external cause status: Secondary | ICD-10-CM | POA: Insufficient documentation

## 2018-04-24 DIAGNOSIS — Y939 Activity, unspecified: Secondary | ICD-10-CM | POA: Diagnosis not present

## 2018-04-24 DIAGNOSIS — Z8673 Personal history of transient ischemic attack (TIA), and cerebral infarction without residual deficits: Secondary | ICD-10-CM | POA: Diagnosis not present

## 2018-04-24 DIAGNOSIS — R51 Headache: Secondary | ICD-10-CM | POA: Insufficient documentation

## 2018-04-24 DIAGNOSIS — S199XXA Unspecified injury of neck, initial encounter: Secondary | ICD-10-CM | POA: Diagnosis present

## 2018-04-24 LAB — BASIC METABOLIC PANEL
Anion gap: 9 (ref 5–15)
BUN: 12 mg/dL (ref 6–20)
CHLORIDE: 107 mmol/L (ref 98–111)
CO2: 24 mmol/L (ref 22–32)
CREATININE: 1.09 mg/dL (ref 0.61–1.24)
Calcium: 9.1 mg/dL (ref 8.9–10.3)
GFR calc Af Amer: >60 mL/min (ref >60)
GFR calc non Af Amer: >60 mL/min (ref >60)
Glucose, Bld: 109 mg/dL — ABNORMAL HIGH (ref 70–99)
POTASSIUM: 4 mmol/L (ref 3.5–5.1)
SODIUM: 140 mmol/L (ref 135–145)

## 2018-04-24 LAB — CBC WITH DIFFERENTIAL/PLATELET
Basophils Absolute: 0 10*3/uL (ref 0.0–0.1)
Basophils Relative: 1 %
Eosinophils Absolute: 0.4 10*3/uL (ref 0.0–0.7)
Eosinophils Relative: 7 %
HEMATOCRIT: 40.2 % (ref 39.0–52.0)
Hemoglobin: 13.6 g/dL (ref 13.0–17.0)
Lymphocytes Relative: 34 %
Lymphs Abs: 1.7 10*3/uL (ref 0.7–4.0)
MCH: 30.8 pg (ref 26.0–34.0)
MCHC: 33.8 g/dL (ref 30.0–36.0)
MCV: 91.2 fL (ref 78.0–100.0)
MONO ABS: 0.5 10*3/uL (ref 0.1–1.0)
Monocytes Relative: 10 %
Neutro Abs: 2.5 10*3/uL (ref 1.7–7.7)
Neutrophils Relative %: 48 %
Platelets: 180 10*3/uL (ref 150–400)
RBC: 4.41 MIL/uL (ref 4.22–5.81)
RDW: 12.1 % (ref 11.5–15.5)
WBC: 5.1 10*3/uL (ref 4.0–10.5)

## 2018-04-24 LAB — TROPONIN I

## 2018-04-24 MED ORDER — HYDROCODONE-ACETAMINOPHEN 5-325 MG PO TABS
1.0000 | ORAL_TABLET | Freq: Once | ORAL | Status: AC
  Administered 2018-04-24: 1 via ORAL
  Filled 2018-04-24: qty 1

## 2018-04-24 MED ORDER — TRAMADOL HCL 50 MG PO TABS: 50.0000 mg | ORAL_TABLET | Freq: Four times a day (QID) | ORAL | 0 refills | Status: DC | PRN

## 2018-04-24 MED ORDER — CYCLOBENZAPRINE HCL 10 MG PO TABS: 10.0000 mg | ORAL_TABLET | Freq: Three times a day (TID) | ORAL | 0 refills | Status: DC | PRN

## 2018-04-24 MED ORDER — CYCLOBENZAPRINE HCL 10 MG PO TABS
10.0000 mg | ORAL_TABLET | Freq: Once | ORAL | Status: AC
  Administered 2018-04-24: 10 mg via ORAL
  Filled 2018-04-24: qty 1
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
## 2018-04-24 NOTE — Discharge Instructions (Addendum)
Apply ice packs on and off to your neck and back.  Follow-up with your primary doctor or the clinic listed for recheck.
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
## 2019-02-10 ENCOUNTER — Other Ambulatory Visit: Payer: Self-pay

## 2019-02-10 ENCOUNTER — Other Ambulatory Visit: Payer: Self-pay | Admitting: Internal Medicine

## 2019-02-10 DIAGNOSIS — Z20822 Contact with and (suspected) exposure to covid-19: Secondary | ICD-10-CM

## 2019-02-14 LAB — NOVEL CORONAVIRUS, NAA: SARS-CoV-2, NAA: NOT DETECTED

## 2019-08-06 ENCOUNTER — Other Ambulatory Visit: Payer: Self-pay

## 2019-08-06 DIAGNOSIS — Z20822 Contact with and (suspected) exposure to covid-19: Secondary | ICD-10-CM

## 2019-08-09 LAB — NOVEL CORONAVIRUS, NAA: SARS-CoV-2, NAA: NOT DETECTED

## 2019-09-04 ENCOUNTER — Ambulatory Visit: Payer: Self-pay | Attending: Internal Medicine

## 2019-09-04 ENCOUNTER — Other Ambulatory Visit: Payer: Self-pay

## 2019-09-04 DIAGNOSIS — Z20822 Contact with and (suspected) exposure to covid-19: Secondary | ICD-10-CM

## 2019-09-06 LAB — SPECIMEN STATUS REPORT

## 2019-09-06 LAB — NOVEL CORONAVIRUS, NAA: SARS-CoV-2, NAA: NOT DETECTED

## 2020-03-18 ENCOUNTER — Other Ambulatory Visit: Payer: Self-pay

## 2020-03-18 ENCOUNTER — Encounter (HOSPITAL_COMMUNITY): Payer: Self-pay

## 2020-03-18 ENCOUNTER — Emergency Department (HOSPITAL_COMMUNITY)
Admission: EM | Admit: 2020-03-18 | Discharge: 2020-03-18 | Disposition: A | Discharge status: Home / Self Care | Payer: BC Managed Care – PPO | Attending: Emergency Medicine | Admitting: Emergency Medicine

## 2020-03-18 ENCOUNTER — Emergency Department (HOSPITAL_COMMUNITY): Payer: BC Managed Care – PPO

## 2020-03-18 DIAGNOSIS — R1013 Epigastric pain: Secondary | ICD-10-CM | POA: Insufficient documentation

## 2020-03-18 DIAGNOSIS — F17210 Nicotine dependence, cigarettes, uncomplicated: Secondary | ICD-10-CM | POA: Diagnosis not present

## 2020-03-18 LAB — URINALYSIS, ROUTINE W REFLEX MICROSCOPIC
Bacteria, UA: NONE SEEN
Bilirubin Urine: NEGATIVE
Glucose, UA: NEGATIVE mg/dL
Hgb urine dipstick: NEGATIVE
Ketones, ur: NEGATIVE mg/dL
Nitrite: NEGATIVE
Protein, ur: NEGATIVE mg/dL
Specific Gravity, Urine: 1.02 (ref 1.005–1.030)
pH: 5 (ref 5.0–8.0)

## 2020-03-18 LAB — CBC WITH DIFFERENTIAL/PLATELET
Abs Immature Granulocytes: 0.03 10*3/uL (ref 0.00–0.07)
Basophils Absolute: 0 10*3/uL (ref 0.0–0.1)
Basophils Relative: 1 %
Eosinophils Absolute: 0.4 10*3/uL (ref 0.0–0.5)
Eosinophils Relative: 7 %
HCT: 42.7 % (ref 39.0–52.0)
Hemoglobin: 14.4 g/dL (ref 13.0–17.0)
Immature Granulocytes: 1 %
Lymphocytes Relative: 27 %
Lymphs Abs: 1.4 10*3/uL (ref 0.7–4.0)
MCH: 32.6 pg (ref 26.0–34.0)
MCHC: 33.7 g/dL (ref 30.0–36.0)
MCV: 96.6 fL (ref 80.0–100.0)
Monocytes Absolute: 0.4 10*3/uL (ref 0.1–1.0)
Monocytes Relative: 8 %
Neutro Abs: 3 10*3/uL (ref 1.7–7.7)
Neutrophils Relative %: 56 %
Platelets: 203 10*3/uL (ref 150–400)
RBC: 4.42 MIL/uL (ref 4.22–5.81)
RDW: 12.5 % (ref 11.5–15.5)
WBC: 5.3 10*3/uL (ref 4.0–10.5)
nRBC: 0 % (ref 0.0–0.2)

## 2020-03-18 LAB — COMPREHENSIVE METABOLIC PANEL
ALT: 25 U/L (ref 0–44)
AST: 21 U/L (ref 15–41)
Albumin: 4.4 g/dL (ref 3.5–5.0)
Alkaline Phosphatase: 49 U/L (ref 38–126)
Anion gap: 9 (ref 5–15)
BUN: 20 mg/dL (ref 6–20)
CO2: 26 mmol/L (ref 22–32)
Calcium: 9.3 mg/dL (ref 8.9–10.3)
Chloride: 104 mmol/L (ref 98–111)
Creatinine, Ser: 0.97 mg/dL (ref 0.61–1.24)
GFR calc Af Amer: >60 mL/min (ref >60)
GFR calc non Af Amer: >60 mL/min (ref >60)
Glucose, Bld: 111 mg/dL — ABNORMAL HIGH (ref 70–99)
Potassium: 4.4 mmol/L (ref 3.5–5.1)
Sodium: 139 mmol/L (ref 135–145)
Total Bilirubin: 0.6 mg/dL (ref 0.3–1.2)
Total Protein: 7.4 g/dL (ref 6.5–8.1)

## 2020-03-18 LAB — LIPASE, BLOOD: Lipase: 29 U/L (ref 11–51)

## 2020-03-18 LAB — TROPONIN I (HIGH SENSITIVITY): Troponin I (High Sensitivity): 3 ng/L (ref <18)

## 2020-03-18 MED ORDER — MORPHINE SULFATE (PF) 4 MG/ML IV SOLN
4.0000 mg | Freq: Once | INTRAVENOUS | Status: AC
  Administered 2020-03-18: 4 mg via INTRAVENOUS
  Filled 2020-03-18: qty 1

## 2020-03-18 MED ORDER — ONDANSETRON HCL 4 MG/2ML IJ SOLN
4.0000 mg | Freq: Once | INTRAMUSCULAR | Status: AC
  Administered 2020-03-18: 4 mg via INTRAVENOUS
  Filled 2020-03-18: qty 2

## 2020-03-18 MED ORDER — FAMOTIDINE 40 MG/5ML PO SUSR
20.0000 mg | Freq: Every day | ORAL | Status: DC
  Administered 2020-03-18: 20 mg via ORAL
  Filled 2020-03-18 (×2): qty 2.5

## 2020-03-18 MED ORDER — FAMOTIDINE 20 MG PO TABS: 20.0000 mg | ORAL_TABLET | Freq: Once | ORAL | Status: DC

## 2020-03-18 MED ORDER — FAMOTIDINE 20 MG PO TABS: 20.0000 mg | ORAL_TABLET | Freq: Two times a day (BID) | ORAL | 0 refills | Status: DC

## 2020-03-18 MED ORDER — IOHEXOL 300 MG/ML  SOLN
100.0000 mL | Freq: Once | INTRAMUSCULAR | Status: AC | PRN
  Administered 2020-03-18: 100 mL via INTRAVENOUS

## 2020-03-18 MED ORDER — ONDANSETRON 4 MG PO TBDP: 4.0000 mg | ORAL_TABLET | Freq: Three times a day (TID) | ORAL | 0 refills | Status: DC | PRN
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
## 2020-03-18 NOTE — ED Notes (Signed)
Advised patient we needed urine specimen. 

## 2020-03-18 NOTE — ED Notes (Signed)
Attempted IV twice. Delay in meds due to this reason.

## 2020-03-18 NOTE — Discharge Instructions (Signed)
Your lab tests and CT imaging is negative for any acute evidence of your pain including to evidence of pancreatitis.  It is possible you have gastritis given the location of pain and being triggered by eating/drinking.  Use the medicines prescribed for symptom relief.  Call the Gastroenterology specialist listed for further evaluation if your symptoms continue.  Avoid alcohol use.  Return here for any worsened pain, fevers, vomiting or any new symptoms.   You have also been given suggestions for obtaining a primary MD

## 2020-03-18 NOTE — Clinical Social Work Note (Signed)
Transition of Care [facility]) - Emergency Department Mini Assessment  Patient Details  Name: Pasquale Matters MRN: [id number] Date of Birth: 27-Mar-1966  [facility]) CM/SW Contact:    Sherie Don, LCSW Phone Number: 03/18/2020, 2:45 PM  Clinical Narrative: Patient is a 54 year male who presented to the ED for mid-sternal pain. Per chart review, patient has insurance but no PCP. CSW spoke with patient regarding PCP resource list and patient is agreeable. CSW provided patient with list of PCPs that currently accept [employer]. TOC signing off.  ED Mini Assessment: What brought you to the Emergency Department? : Midsternal pain Barriers to Discharge: ED Barriers Resolved Barrier interventions: Provided PCP resource list Means of departure: Car Interventions which prevented an admission or readmission: Other (must enter comment) (PCP resource list)  Admission diagnosis:  midsternal pain Patient Active Problem List   Diagnosis Date Noted  . Complicated migraine 95/74/7340  . Mixed hyperlipidemia 06/08/2017  . Paresthesia of left arm and leg 05/27/2017  . Tobacco use 05/27/2017   PCP:  Patient, No Pcp Per Pharmacy:   [place] [place] [facility] [address] [place] [address] Phone: [phone] Fax: [phone]

## 2020-03-18 NOTE — ED Triage Notes (Signed)
Pt is having abdominal pain that began yesterday. Hx of pancreatitis. Pt has drank alcohol in the last week. Nausea, but denies vomiting.

## 2020-03-20 LAB — URINE CULTURE: Culture: NO GROWTH

## 2020-12-07 IMAGING — CT CT ABD-PELV W/ CM
2 of 5 series · 16 of 46 positions shown, 18 images · IV contrast (omnipaque)
Comparison: None.

CLINICAL DATA: Generalized abdominal pain and nausea beginning
yesterday.

EXAM:
CT ABDOMEN AND PELVIS WITH CONTRAST
TECHNIQUE: Multidetector CT imaging of the abdomen and pelvis was performed
using the standard protocol following bolus administration of
intravenous contrast.
CONTRAST:  100mL OMNIPAQUE IOHEXOL 300 MG/ML  SOLN

[Series 2: axial st · axial · 0.85mm/px · z∈[+894,+1319]mm · 13 of 97 slices shown, 15 images]
[im 6/97  soft-tissue]
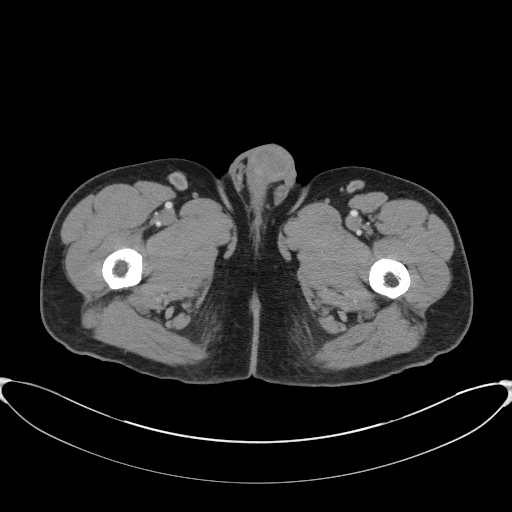
[im 6/97  bone]
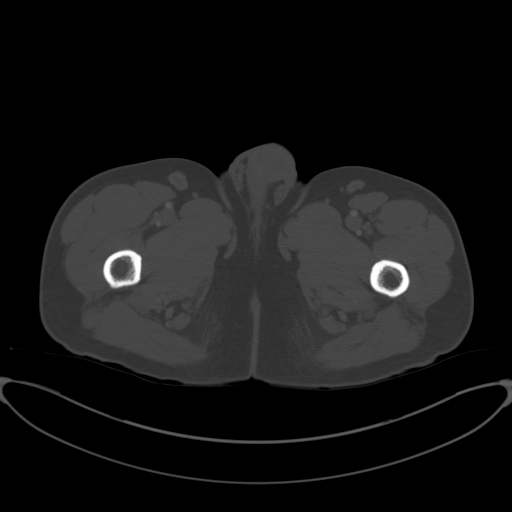
[im 11/97  soft-tissue]
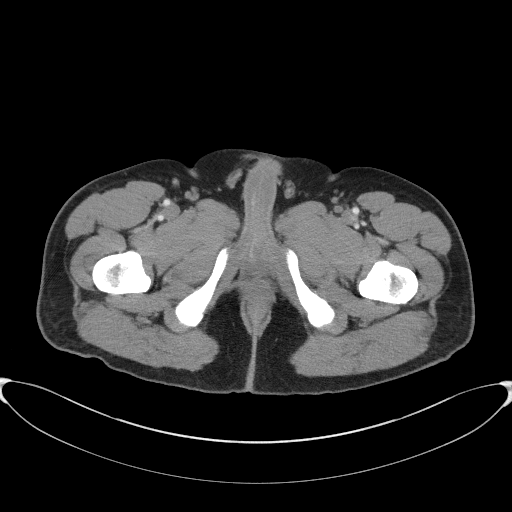
[im 22/97  soft-tissue]
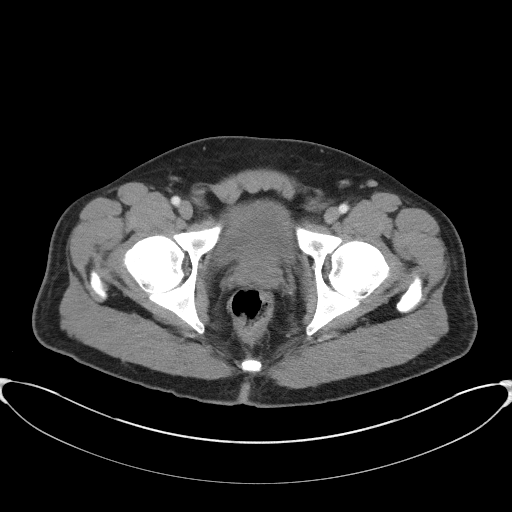
[im 27/97  soft-tissue]
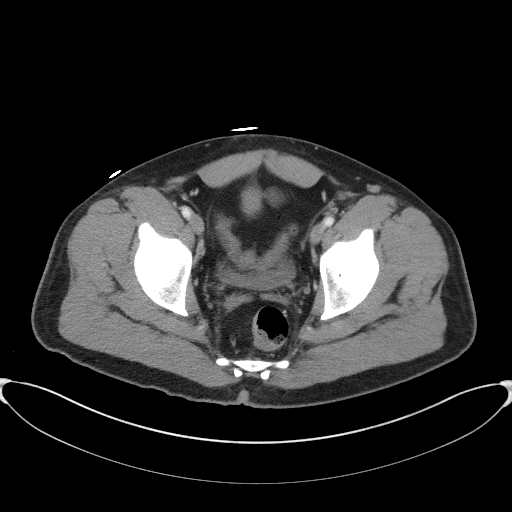
[im 33/97  soft-tissue]
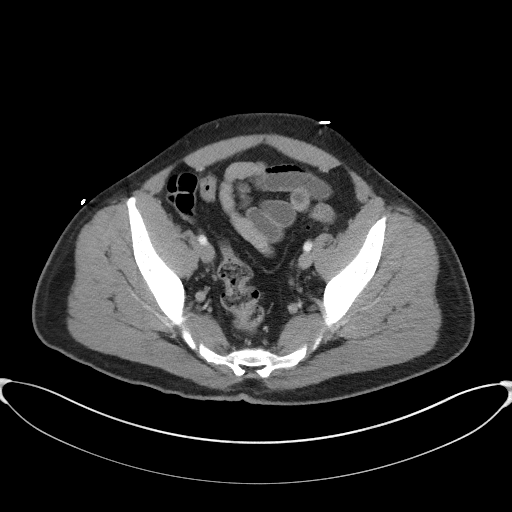
[im 43/97  soft-tissue]
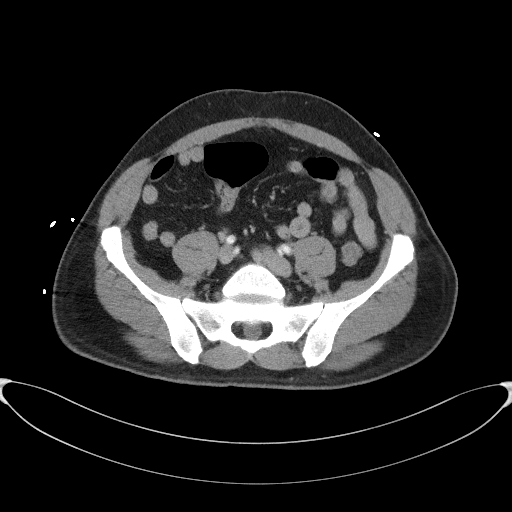
[im 49/97  soft-tissue]
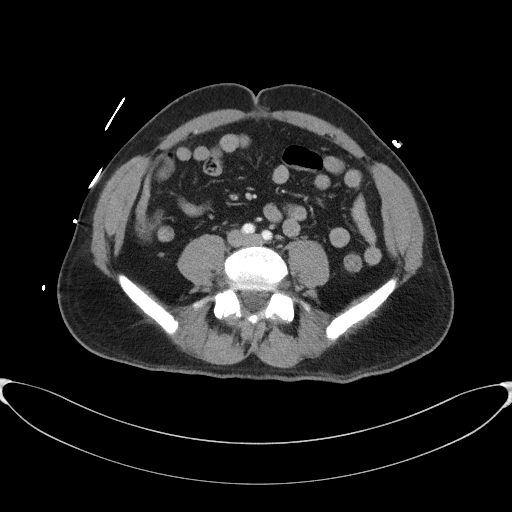
[im 54/97  soft-tissue]
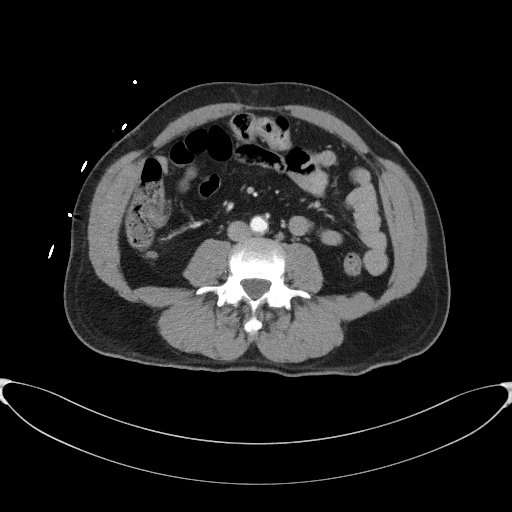
[im 65/97  soft-tissue]
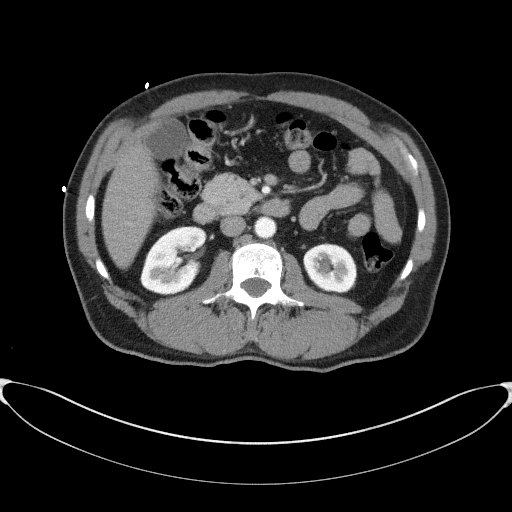
[im 65/97  bone]
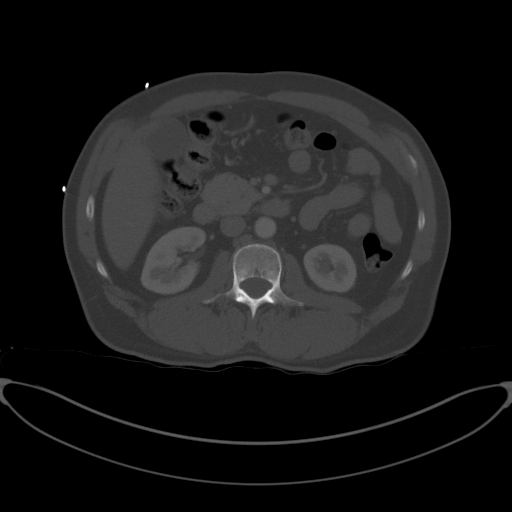
[im 70/97  soft-tissue]
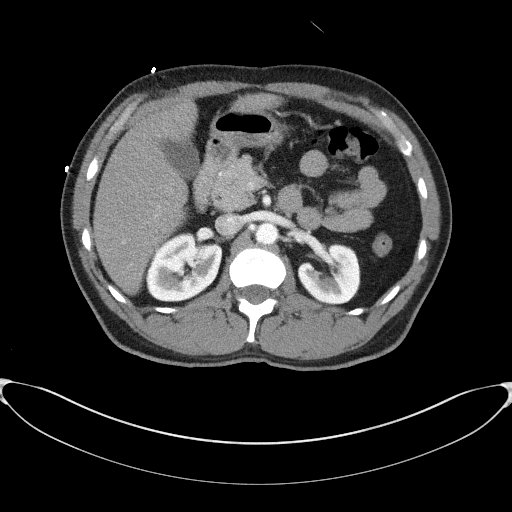
[im 75/97  soft-tissue]
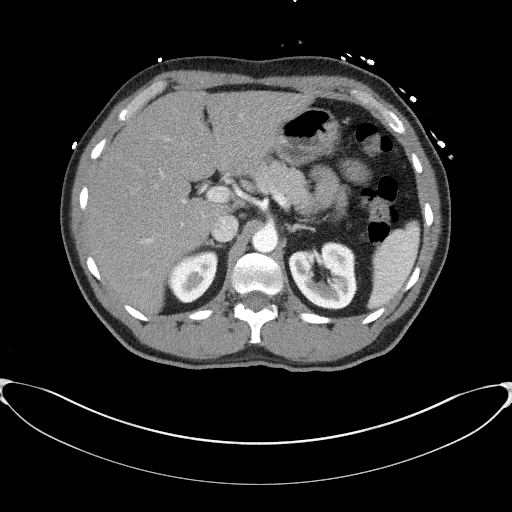
[im 86/97  soft-tissue]
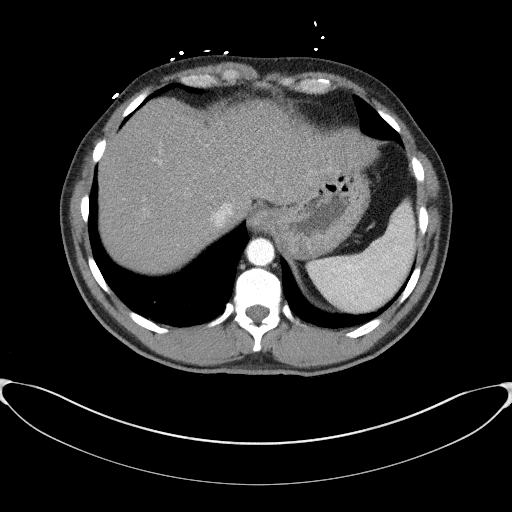
[im 91/97  soft-tissue]
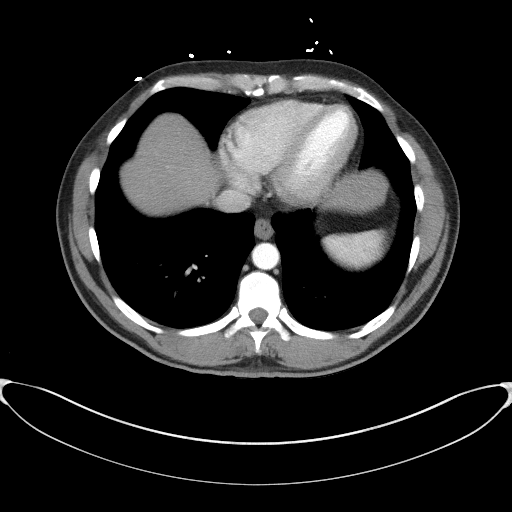

[Series 6: coronal st · coronal · 0.75mm/px · 3 of 106 slices shown]
[im 36/106  soft-tissue]
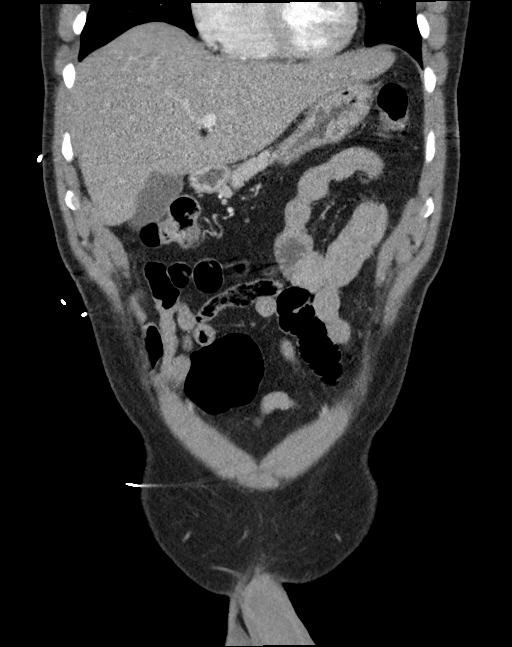
[im 47/106  soft-tissue]
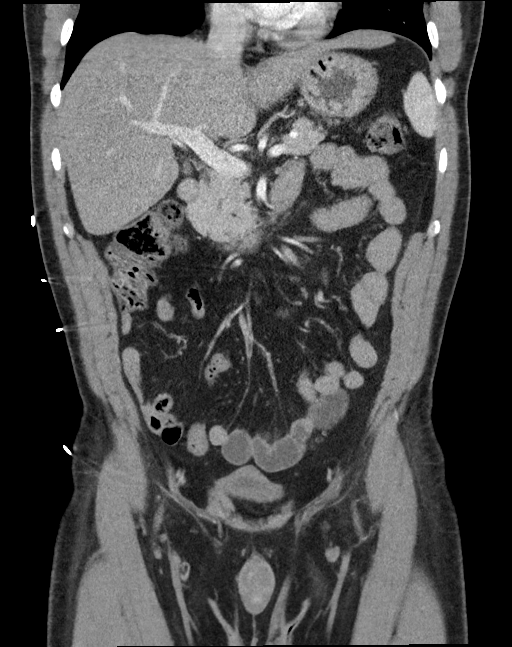
[im 59/106  soft-tissue]
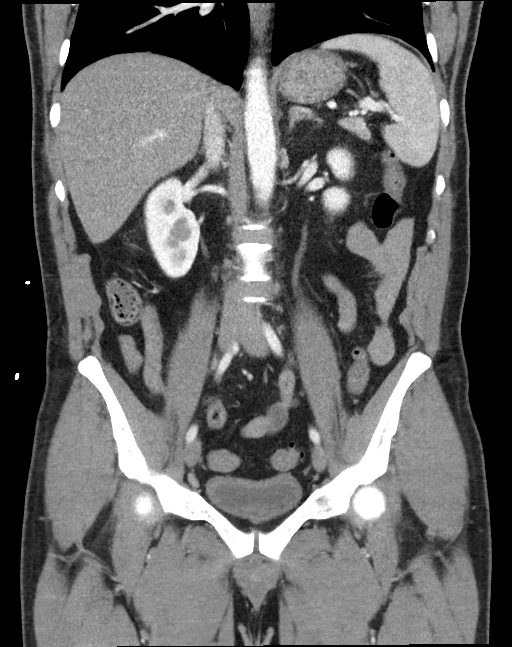

[16 of 46 positions shown; findings below may reference images not displayed]

FINDINGS: Lower Chest: No acute findings.

Hepatobiliary: No hepatic masses identified. Mild diffuse hepatic
steatosis noted. Gallbladder is unremarkable. No evidence of biliary
ductal dilatation.

Pancreas:  No mass or inflammatory changes.

Spleen: Within normal limits in size and appearance.

Adrenals/Urinary Tract: No masses identified. No evidence of
ureteral calculi or hydronephrosis.

Stomach/Bowel: No evidence of obstruction, inflammatory process or
abnormal fluid collections. Normal appendix visualized. Mild
Diverticulosis is seen involving the sigmoid colon, however there is
no evidence of diverticulitis.

Vascular/Lymphatic: No pathologically enlarged lymph nodes. No
abdominal aortic aneurysm. Aortic atherosclerosis noted.

Reproductive:  No mass or other significant abnormality.

Other:  None.

Musculoskeletal:  No suspicious bone lesions identified.
IMPRESSION: No acute findings within the abdomen or pelvis.

Mild sigmoid diverticulosis, without radiographic evidence of
diverticulitis.

Mild hepatic steatosis.

Aortic Atherosclerosis ([id number]).

## 2021-04-16 ENCOUNTER — Emergency Department (HOSPITAL_COMMUNITY): Payer: BLUE CROSS/BLUE SHIELD

## 2021-04-16 ENCOUNTER — Encounter (HOSPITAL_COMMUNITY): Payer: Self-pay | Admitting: Emergency Medicine

## 2021-04-16 ENCOUNTER — Other Ambulatory Visit: Payer: Self-pay

## 2021-04-16 ENCOUNTER — Observation Stay (HOSPITAL_COMMUNITY)
Admission: EM | Admit: 2021-04-16 | Discharge: 2021-04-17 | Disposition: A | Discharge status: Home / Self Care | Payer: BLUE CROSS/BLUE SHIELD | Attending: Internal Medicine | Admitting: Internal Medicine

## 2021-04-16 DIAGNOSIS — Z720 Tobacco use: Secondary | ICD-10-CM | POA: Diagnosis present

## 2021-04-16 DIAGNOSIS — Y900 Blood alcohol level of less than 20 mg/100 ml: Secondary | ICD-10-CM | POA: Diagnosis not present

## 2021-04-16 DIAGNOSIS — Z7982 Long term (current) use of aspirin: Secondary | ICD-10-CM | POA: Diagnosis not present

## 2021-04-16 DIAGNOSIS — E782 Mixed hyperlipidemia: Secondary | ICD-10-CM | POA: Diagnosis not present

## 2021-04-16 DIAGNOSIS — Z20822 Contact with and (suspected) exposure to covid-19: Secondary | ICD-10-CM | POA: Insufficient documentation

## 2021-04-16 DIAGNOSIS — G459 Transient cerebral ischemic attack, unspecified: Secondary | ICD-10-CM | POA: Diagnosis not present

## 2021-04-16 DIAGNOSIS — R200 Anesthesia of skin: Secondary | ICD-10-CM | POA: Diagnosis not present

## 2021-04-16 DIAGNOSIS — Z8673 Personal history of transient ischemic attack (TIA), and cerebral infarction without residual deficits: Secondary | ICD-10-CM

## 2021-04-16 DIAGNOSIS — F17210 Nicotine dependence, cigarettes, uncomplicated: Secondary | ICD-10-CM | POA: Insufficient documentation

## 2021-04-16 LAB — DIFFERENTIAL
Abs Immature Granulocytes: 0.03 10*3/uL (ref 0.00–0.07)
Basophils Absolute: 0 10*3/uL (ref 0.0–0.1)
Basophils Relative: 1 %
Eosinophils Absolute: 0.3 10*3/uL (ref 0.0–0.5)
Eosinophils Relative: 5 %
Immature Granulocytes: 1 %
Lymphocytes Relative: 27 %
Lymphs Abs: 1.6 10*3/uL (ref 0.7–4.0)
Monocytes Absolute: 0.5 10*3/uL (ref 0.1–1.0)
Monocytes Relative: 8 %
Neutro Abs: 3.7 10*3/uL (ref 1.7–7.7)
Neutrophils Relative %: 58 %

## 2021-04-16 LAB — CBC
HCT: 43.9 % (ref 39.0–52.0)
Hemoglobin: 15.5 g/dL (ref 13.0–17.0)
MCH: 33.1 pg (ref 26.0–34.0)
MCHC: 35.3 g/dL (ref 30.0–36.0)
MCV: 93.8 fL (ref 80.0–100.0)
Platelets: 211 10*3/uL (ref 150–400)
RBC: 4.68 MIL/uL (ref 4.22–5.81)
RDW: 12.4 % (ref 11.5–15.5)
WBC: 6.2 10*3/uL (ref 4.0–10.5)
nRBC: 0 % (ref 0.0–0.2)

## 2021-04-16 LAB — RESP PANEL BY RT-PCR (FLU A&B, COVID) ARPGX2
Influenza A by PCR: NEGATIVE
Influenza B by PCR: NEGATIVE
SARS Coronavirus 2 by RT PCR: NEGATIVE

## 2021-04-16 LAB — COMPREHENSIVE METABOLIC PANEL
ALT: 25 U/L (ref 0–44)
AST: 22 U/L (ref 15–41)
Albumin: 4.4 g/dL (ref 3.5–5.0)
Alkaline Phosphatase: 68 U/L (ref 38–126)
Anion gap: 6 (ref 5–15)
BUN: 16 mg/dL (ref 6–20)
CO2: 22 mmol/L (ref 22–32)
Calcium: 8.7 mg/dL — ABNORMAL LOW (ref 8.9–10.3)
Chloride: 104 mmol/L (ref 98–111)
Creatinine, Ser: 1.02 mg/dL (ref 0.61–1.24)
GFR, Estimated: >60 mL/min (ref >60)
Glucose, Bld: 192 mg/dL — ABNORMAL HIGH (ref 70–99)
Potassium: 3.4 mmol/L — ABNORMAL LOW (ref 3.5–5.1)
Sodium: 132 mmol/L — ABNORMAL LOW (ref 135–145)
Total Bilirubin: 0.6 mg/dL (ref 0.3–1.2)
Total Protein: 8 g/dL (ref 6.5–8.1)

## 2021-04-16 LAB — URINALYSIS, ROUTINE W REFLEX MICROSCOPIC
Bacteria, UA: NONE SEEN
Bilirubin Urine: NEGATIVE
Glucose, UA: NEGATIVE mg/dL
Ketones, ur: NEGATIVE mg/dL
Nitrite: NEGATIVE
Protein, ur: NEGATIVE mg/dL
Specific Gravity, Urine: 1.019 (ref 1.005–1.030)
pH: 5 (ref 5.0–8.0)

## 2021-04-16 LAB — ETHANOL: Alcohol, Ethyl (B): <10 mg/dL (ref <10)

## 2021-04-16 LAB — RAPID URINE DRUG SCREEN, HOSP PERFORMED
Amphetamines: NOT DETECTED
Barbiturates: NOT DETECTED
Benzodiazepines: NOT DETECTED
Cocaine: NOT DETECTED
Opiates: NOT DETECTED
Tetrahydrocannabinol: NOT DETECTED

## 2021-04-16 LAB — PROTIME-INR
INR: 0.9 (ref 0.8–1.2)
Prothrombin Time: 12.5 seconds (ref 11.4–15.2)

## 2021-04-16 LAB — APTT: aPTT: 27 seconds (ref 24–36)

## 2021-04-16 MED ORDER — FAMOTIDINE 20 MG PO TABS
20.0000 mg | ORAL_TABLET | Freq: Two times a day (BID) | ORAL | Status: DC
  Administered 2021-04-17 (×2): 20 mg via ORAL
  Filled 2021-04-16 (×2): qty 1

## 2021-04-16 MED ORDER — ONDANSETRON 4 MG PO TBDP: 4.0000 mg | ORAL_TABLET | Freq: Three times a day (TID) | ORAL | Status: DC | PRN

## 2021-04-16 MED ORDER — SIMVASTATIN 20 MG PO TABS: 20.0000 mg | ORAL_TABLET | Freq: Every day | ORAL | Status: DC

## 2021-04-16 MED ORDER — LORAZEPAM 2 MG/ML IJ SOLN
1.0000 mg | Freq: Once | INTRAMUSCULAR | Status: DC
Start: 2021-04-16 — End: 2021-04-18

## 2021-04-16 MED ORDER — ASPIRIN 81 MG PO CHEW
162.0000 mg | CHEWABLE_TABLET | Freq: Once | ORAL | Status: AC
  Administered 2021-04-16: 162 mg via ORAL
  Filled 2021-04-16: qty 2

## 2021-04-16 MED ORDER — IOHEXOL 350 MG/ML SOLN
75.0000 mL | Freq: Once | INTRAVENOUS | Status: AC | PRN
  Administered 2021-04-16: 75 mL via INTRAVENOUS
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
## 2021-04-16 NOTE — ED Triage Notes (Signed)
Left sided droop present. Pt speech sounds slurred. Blurry vision on left side

## 2021-04-16 NOTE — Progress Notes (Signed)
I went to patient's chart to help Jamelle Haring RN to figure out an order.

## 2021-04-16 NOTE — ED Notes (Signed)
Attempted to call report x 1  

## 2021-04-16 NOTE — ED Notes (Signed)
Patient transported to CT 

## 2021-04-16 NOTE — ED Triage Notes (Signed)
Pt c/o left sided numbness starting at 0830 this morning. EDP aware of pt complaint
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
## 2021-04-17 ENCOUNTER — Observation Stay (HOSPITAL_BASED_OUTPATIENT_CLINIC_OR_DEPARTMENT_OTHER): Payer: BLUE CROSS/BLUE SHIELD

## 2021-04-17 ENCOUNTER — Observation Stay (HOSPITAL_COMMUNITY): Payer: BLUE CROSS/BLUE SHIELD

## 2021-04-17 DIAGNOSIS — R200 Anesthesia of skin: Secondary | ICD-10-CM | POA: Diagnosis not present

## 2021-04-17 DIAGNOSIS — G459 Transient cerebral ischemic attack, unspecified: Secondary | ICD-10-CM | POA: Diagnosis not present

## 2021-04-17 LAB — LIPID PANEL
Cholesterol: 202 mg/dL — ABNORMAL HIGH (ref 0–200)
HDL: 23 mg/dL — ABNORMAL LOW (ref >40)
LDL Cholesterol: UNDETERMINED mg/dL (ref 0–99)
Total CHOL/HDL Ratio: 8.8 RATIO
Triglycerides: 1092 mg/dL — ABNORMAL HIGH (ref <150)
VLDL: UNDETERMINED mg/dL (ref 0–40)

## 2021-04-17 LAB — HEMOGLOBIN A1C
Hgb A1c MFr Bld: 6.1 % — ABNORMAL HIGH (ref 4.8–5.6)
Mean Plasma Glucose: 128.37 mg/dL

## 2021-04-17 LAB — ECHOCARDIOGRAM COMPLETE
Area-P 1/2: 3.21 cm2
Height: 71 in
S' Lateral: 2.7 cm
Weight: 2787.2 oz

## 2021-04-17 LAB — LDL CHOLESTEROL, DIRECT: Direct LDL: 67.3 mg/dL (ref 0–99)

## 2021-04-17 LAB — HIV ANTIBODY (ROUTINE TESTING W REFLEX): HIV Screen 4th Generation wRfx: NONREACTIVE

## 2021-04-17 MED ORDER — ACETAMINOPHEN 325 MG PO TABS: 650.0000 mg | ORAL_TABLET | ORAL | Status: DC | PRN

## 2021-04-17 MED ORDER — ASPIRIN 325 MG PO TABS
325.0000 mg | ORAL_TABLET | Freq: Every day | ORAL | Status: DC
  Administered 2021-04-17: 325 mg via ORAL
  Filled 2021-04-17: qty 1

## 2021-04-17 MED ORDER — ACETAMINOPHEN 160 MG/5ML PO SOLN: 650.0000 mg | ORAL | Status: DC | PRN

## 2021-04-17 MED ORDER — STROKE: EARLY STAGES OF RECOVERY BOOK
Freq: Once | Status: DC
  Filled 2021-04-17: qty 1

## 2021-04-17 MED ORDER — ATORVASTATIN CALCIUM 80 MG PO TABS
80.0000 mg | ORAL_TABLET | Freq: Every day | ORAL | Status: DC
  Administered 2021-04-17: 80 mg via ORAL
  Filled 2021-04-17: qty 1

## 2021-04-17 MED ORDER — ACETAMINOPHEN 650 MG RE SUPP: 650.0000 mg | RECTAL | Status: DC | PRN

## 2021-04-17 MED ORDER — ATORVASTATIN CALCIUM 80 MG PO TABS: 80.0000 mg | ORAL_TABLET | Freq: Every day | ORAL | 0 refills | Status: DC

## 2021-04-17 MED ORDER — ASPIRIN 300 MG RE SUPP
300.0000 mg | Freq: Every day | RECTAL | Status: DC
  Filled 2021-04-17: qty 1

## 2021-04-17 MED ORDER — ASPIRIN EC 81 MG PO TBEC: 81.0000 mg | DELAYED_RELEASE_TABLET | Freq: Every day | ORAL | 2 refills | Status: DC

## 2021-04-17 NOTE — Progress Notes (Signed)
Pt IV removed, cathter intact. Tele removed, ccmd aware. Pt has all belongings and understands d/c instructions. Pt d/c via wheelchair by RN.

## 2021-04-17 NOTE — Progress Notes (Signed)
PT Cancellation Note  Patient Details Name: Nathaniel Dunlap MRN: [id number] DOB: [birth year]   Cancelled Treatment:    Reason Eval/Treat Not Completed: PT screened, no needs identified, will sign off. OT completed an evaluation of the patient and he was independent with all functional mobility. No PT concerns were identified during the OT evaluation. PT will sign off.   Clearnce Sorrel Natalin Bible 04/17/2021, 11:13 AM

## 2021-04-17 NOTE — Progress Notes (Signed)
  Echocardiogram 2D Echocardiogram has been performed.  Nathaniel Dunlap 04/17/2021, 5:29 PM

## 2021-04-17 NOTE — Progress Notes (Signed)
[facility] Progress Note  Patient: Nathaniel Dunlap    [id number]  DOA: 04/16/2021     Date of Service: the patient was seen and examined on 04/17/2021  Brief hospital course: : Nathaniel Dunlap is a 55 y.o. male with a history of h/o stroke, tobacco abuse. He presents with left sided numbness and headache that started at 8am.  Currently plan is further work-up for TIA.  Subjective: No symptoms.  No nausea no vomiting but no fever no chills.  Has some headache.  No numbness.  Assessment and Plan: 1.  TIA MRI negative for any acute stroke. CTA head and neck negative for any large vessel occlusion. Echocardiogram performed currently pending. Lipid panel significantly abnormal although not fasting. Hemoglobin A1c pending. Patient does not take aspirin prior to admission.  Will initiate 81 mg aspirin EC. Patient does not take cholesterol medication prior to admission.  Will initiate Lipitor 80 mg. PT OT recommends no further work-up. Passed swallowing evaluation. Blood pressure stable.  2.  Hyperlipidemia Triglyceride more than 1000.  LDL significantly elevated as well. Will recheck tomorrow morning. Currently on Lipitor.  3.  Active smoker Counseled the patient to quit. Continue to monitor.  4.  GERD Continue PPI.  Scheduled Meds:   stroke: mapping our early stages of recovery book   Does not apply Once   aspirin  300 mg Rectal Daily   Or   aspirin  325 mg Oral Daily   atorvastatin  80 mg Oral Daily   famotidine  20 mg Oral BID   LORazepam  1 mg Intravenous Once   Continuous Infusions: PRN Meds: acetaminophen **OR** acetaminophen (TYLENOL) oral liquid 160 mg/5 mL **OR** acetaminophen, ondansetron  Body mass index is 24.3 kg/m.        DVT Prophylaxis:   SCD's Start: 04/17/21 0011    Advance goals of care discussion: Pt is Full code.  Family Communication: no family was present at bedside, at the time of interview.   Data Reviewed: I have personally  reviewed and interpreted daily labs, tele strips, imaging. MRI brain negative for any acute stroke.  Triglyceride level more than 1000.  Total cholesterol more than 200.  Physical Exam:  General: Appear in mild distress, no Rash; Oral Mucosa Clear, moist. no Abnormal Neck Mass Or lumps, Conjunctiva normal  Cardiovascular: S1 and S2 Present, no Murmur, Respiratory: good respiratory effort, Bilateral Air entry present and CTA, no Crackles, no wheezes Abdomen: Bowel Sound present, Soft and no tenderness Extremities: no Pedal edema Neurology: alert and oriented to time, place, and person affect appropriate. no new focal deficit Gait not checked due to patient safety concerns   Vitals:   04/17/21 0100 04/17/21 0555 04/17/21 1100 04/17/21 1756  BP: 126/70 133/77 122/79 124/81  Pulse: 67 63 63 64  Resp: '18 18 20 19  '$ Temp: 98.1 F (36.7 C) 97.7 F (36.5 C) 98.4 F (36.9 C) 98.6 F (37 C)  TempSrc: Oral Oral Oral Oral  SpO2: 96% 96% 98% 99%  Weight:  79 kg    Height:        Disposition:  Status is: Observation  The patient remains OBS appropriate and will d/c before 2 midnights.  Dispo: The patient is from: Home              Anticipated d/c is to: Home              Patient currently is not medically stable to d/c.   Difficult to place patient No  Time spent: 35 minutes. I reviewed all nursing notes, pharmacy notes, vitals, pertinent old records. I have discussed plan of care as described above with RN.  Author: Berle Mull, MD [facility] 04/17/2021 6:10 PM  To reach On-call, see care teams to locate the attending and reach out via [email or web address]. Between 7PM-7AM, please contact night-coverage If you still have difficulty reaching the attending provider, please page the [facility] (Director on Call) for [facility] on amion for assistance.

## 2021-04-17 NOTE — Evaluation (Addendum)
 Occupational Therapy Evaluation/Discharge Patient Details Name: Nathaniel Dunlap MRN: [id number] DOB: [birth year] Today's Date: 04/17/2021    History of Present Illness Nathaniel Dunlap is a 55 y.o. male who presented with L sided numbness and headache. Symptoms improving since arrival. CT of head negative for acute infarct, MRI pending. PMH: CVA, tobacco abuse   Clinical Impression   PTA, pt lives with family and Independent in all daily tasks. Pt works for [employer]. Pt presents now feeling back to baseline and Independent for ADLs/mobility without AD. Pt sensation, coordination and strength WFL. No further skilled OT services needed at this time. OT to sign off.     Follow Up Recommendations  No OT follow up    Equipment Recommendations  None recommended by OT    Recommendations for Other Services       Precautions / Restrictions Precautions Precautions: Fall Restrictions Weight Bearing Restrictions: No      Mobility Bed Mobility Overal bed mobility: Independent                  Transfers Overall transfer level: Independent Equipment used: None                  Balance Overall balance assessment: No apparent balance deficits (not formally assessed)                                         ADL either performed or assessed with clinical judgement   ADL Overall ADL's : Independent                                       General ADL Comments: reports feeling back to baseline, denies issues with numbness or speech. B UE strength 5/5. Reports he has been going to bathroom independently in room, denies balance issues. Pt denies any concerns in accessing home with 14 steps.     Vision Patient Visual Report: No change from baseline Vision Assessment?: No apparent visual deficits     Perception     Praxis      Pertinent Vitals/Pain Pain Assessment: No/denies pain     Hand Dominance Right   Extremity/Trunk  Assessment Upper Extremity Assessment Upper Extremity Assessment: Overall WFL for tasks assessed   Lower Extremity Assessment Lower Extremity Assessment: Overall WFL for tasks assessed   Cervical / Trunk Assessment Cervical / Trunk Assessment: Normal   Communication Communication Communication: No difficulties   Cognition Arousal/Alertness: Awake/alert Behavior During Therapy: WFL for tasks assessed/performed Overall Cognitive Status: Within Functional Limits for tasks assessed                                     General Comments       Exercises     Shoulder Instructions      Home Living Family/patient expects to be discharged to:: Private residence Living Arrangements: Spouse/significant other;Children Available Help at Discharge: Family Type of Home: House Home Access: Stairs to enter Technical  of Steps: 14 Entrance Stairs-Rails: Right Home Layout: One level               Home Equipment: None          Prior Functioning/Environment Level of Independence: Independent  Comments: works for [place] [employer]        OT Problem List:        OT Treatment/Interventions:      OT Goals(Current goals can be found in the care plan section) Acute Rehab OT Goals OT Goal Formulation: All assessment and education complete, DC therapy  OT Frequency:     Barriers to D/C:            Co-evaluation              AM-PAC OT "6 Clicks" Daily Activity     Outcome Measure Help from another person eating meals?: None Help from another person taking care of personal grooming?: None Help from another person toileting, which includes using toliet, bedpan, or urinal?: None Help from another person bathing (including washing, rinsing, drying)?: None Help from another person to put on and taking off regular upper body clothing?: None Help from another person to put on and taking off regular lower body clothing?: None 6 Click Score:  24   End of Session    Activity Tolerance: Patient tolerated treatment well Patient left: in bed;with call bell/phone within reach  OT Visit Diagnosis: Other (comment) (impaired sensation)                Time: [id number] OT Time Calculation (min): 15 min Charges:  OT General Charges $OT Visit: 1 Visit OT Evaluation $OT Eval Low Complexity: 1 Low  Malachy Chamber, OTR/L Acute Rehab Services Office: [phone]   Layla Maw 04/17/2021, 7:09 AM

## 2021-04-17 NOTE — Discharge Instructions (Signed)
Primary Health Care  For private/commercial insurance please contact the phone number on the back of your insurance card or contact your insurance provider for covered primary health care providers in your area.  For Medicaid covered primary care please consult the physician listed on your Medicaid card. If there is no physician listed, or the physician is no longer in practice, please contact the Department of Social Services in the county your [place] card is issued from for support.    [facility]  [address] [place],  [place]  [address] Main: [phone] Fax: [phone] Mon-Fri: 8:30AM-5:30PM  [facility] is a patient-centered practice. We provide integrated care in an effort to achieve positive patient experiences and improve patient health care outcomes. Our team is here to support patients in their wellness journeys as well as to provide chronic disease management, lab testing, health education and much more.  [facility]  [address] [place], [place] [address] Phone: [phone] Mon-Fri: 8:30AM-5:30PM   We provide services to children, adolescents, adults and families living with physical, behavioral and mental health challenges.  Our multidisciplinary team delivers evidence based practices to ensure positive outcomes for our patients. We provide an array of Primary Care, Behavioral Health, and Psychiatric services to children, adolescents, adults and families.       [facility]  [address]  [place], [place] [address] Main: [phone]  Mon-Fri: 8AM-8PM,  Saturday and Sunday 8AM to [place] in the [place] off [employer], we provide primary care services for families in [place]. [facility]  [address]   [place], [place] [address] Phone: [phone] Mon-Fri: 8:30AM-5:30PM   [facility] provides primary care services to adults and children 12 years and older in [place].    [facility]  Family Medicine at [place], [place] Phone: [phone] Mon-Fri: 8:30am - 5:30pm  Pediatrics at [place] [place], [place] Phone: [phone] Mon-Fri: 8:30am - 5:30pm Saturdays (October - March): [place] at [facility]. [place], [place] Phone: [phone] M, W & F: 8:00am - 5:00pm [employer]   [facility] is your medical home. A medical home is a doctor's office where the staff knows you, your children, and your family. Here we know your health history, here you should feel comforted and cared for, knowing you're receiving top-notch care from professionals devoted to your well-being. [facility] offers integrated behavioral health care to all patients. In addition our advanced care teams are compromised of a Dietician, (LCSW) Licensed Holiday representative and a Engineer, maintenance.  Our aim is to address mental health and the social drivers by offering education, support and classes for patients through all walks of life.    [facility] [place],  [place], [place] [address] Phone: [phone]   [facility] [place] provides basic healthcare for adults of [place] who cannot afford health insurance and do not qualify for Medicare or Medicaid.  [facility] [address] [place],  [place], [place] [address] Phone: [phone]   We are a non-profit organization that functions on grants, fundraisers  and donations. We serve uninsured [place] residents age 68 and over that fall at or below 150% of the  federal poverty guidelines.   [facility] [place],  [place], [place] [address] Phone:  [phone]  It is the mission of the [facility] to recognize the right of low income, uninsured citizens of [place] to have access to health care that compassionately meets their basic medical and pharmacy needs. The [facility] [place]) [address],  [place], [place] [address] Phone: [phone]  Provide health care services and medicines to uninsured patients who don't qualify for federal or private insurance and have family incomes below 200% of the Federal Poverty Level.

## 2021-04-18 NOTE — Discharge Summary (Signed)
[facility] Discharge Summary   Patient: Nathaniel Dunlap [id number]  PCP: Patient, No Pcp Per (Inactive)  Date of admission: 04/16/2021   Date of discharge: 04/17/2021     Discharge Diagnoses:  Principal Problem:   Left sided numbness Active Problems:   Tobacco use   Mixed hyperlipidemia   History of stroke   Admitted From: home Disposition:  Home   Recommendations for Outpatient Follow-up:  PCP: please follow up with PCP   Follow-up Information     PCP. Schedule an appointment as soon as possible for a visit in 1 week(s).   Why: need fasting lipid panel               Discharge Instructions     Diet - low sodium heart healthy   Complete by: As directed    Increase activity slowly   Complete by: As directed        Diet recommendation: Cardiac diet  Activity: The patient is advised to gradually reintroduce usual activities, as tolerated  Discharge Condition: stable  Code Status: Full code   History of present illness: As per the H and P dictated on admission, "Nathaniel Dunlap is a 55 y.o. male with a history of h/o stroke, tobacco abuse. He presents with left sided numbness and headache that started at 8am. The numbness and headache are improving since arrival.  No other palliating or provoking factors.  He notes no other deficits: No facial droop, word finding difficulty, slurred speech, muscle weakness."  Hospital Course:  Summary of his active problems in the hospital is as following. 1.  TIA MRI negative for any acute stroke. CTA head and neck negative for any large vessel occlusion. Echocardiogram performed currently pending. Lipid panel significantly abnormal although not fasting. Hemoglobin A1c 6.1. Patient does not take aspirin prior to admission.  Will initiate 81 mg aspirin EC. Patient does not take cholesterol medication prior to admission.  Will initiate Lipitor 80 mg. PT OT recommends no further work-up. Passed swallowing  evaluation. Blood pressure stable.   2.  Hyperlipidemia Triglyceride more than 1000.  LDL significantly elevated as well. Recommend to follow-up with PCP for a recheck. Currently on Lipitor.   3.  Active smoker Counseled the patient to quit. Continue to monitor.   4.  GERD Continue PPI.  Patient was ambulatory without any assistance. On the day of the discharge the patient's vitals were stable, and no other new acute medical condition were reported. The patient was felt safe to be discharge at Home with no therapy needed on discharge.  Consultants: none Procedures: Echocardiogram   DISCHARGE MEDICATION: Allergies as of 04/17/2021   No Known Allergies      Medication List     STOP taking these medications    aspirin 81 MG chewable tablet Replaced by: aspirin EC 81 MG tablet   cyclobenzaprine 10 MG tablet Commonly known as: FLEXERIL   ondansetron 4 MG disintegrating tablet Commonly known as: Zofran ODT   simvastatin 20 MG tablet Commonly known as: ZOCOR   traMADol 50 MG tablet Commonly known as: ULTRAM       TAKE these medications    aspirin EC 81 MG tablet Take 1 tablet (81 mg total) by mouth daily. Swallow whole. Replaces: aspirin 81 MG chewable tablet   atorvastatin 80 MG tablet Commonly known as: LIPITOR Take 1 tablet (80 mg total) by mouth daily.   famotidine 20 MG tablet Commonly known as: PEPCID Take 1 tablet (20 mg total) by mouth 2 (  two) times daily.        Discharge Exam: Filed Weights   04/16/21 1352 04/17/21 0555  Weight: 80.3 kg 79 kg   Vitals:   04/17/21 1100 04/17/21 1756  BP: 122/79 124/81  Pulse: 63 64  Resp: 20 19  Temp: 98.4 F (36.9 C) 98.6 F (37 C)  SpO2: 98% 99%   General: Appear in mild distress, no Rash; Oral Mucosa Clear, moist. no Abnormal Neck Mass Or lumps, Conjunctiva normal  Cardiovascular: S1 and S2 Present, no Murmur, Respiratory: good respiratory effort, Bilateral Air entry present and CTA, no  Crackles, no wheezes Abdomen: Bowel Sound present, Soft and no tenderness Extremities: no Pedal edema Neurology: alert and oriented to time, place, and person affect appropriate. no new focal deficit Gait not checked due to patient safety concerns  The results of significant diagnostics from this hospitalization (including imaging, microbiology, ancillary and laboratory) are listed below for reference.    Significant Diagnostic Studies: CT Angio Head W or Wo Contrast  Result Date: 04/16/2021 CLINICAL DATA:  Transient ischemic attack. Left-sided numbness. Left-sided facial droop. Slurred speech. Blurred vision. EXAM: CT ANGIOGRAPHY HEAD AND NECK TECHNIQUE: Multidetector CT imaging of the head and neck was performed using the standard protocol during bolus administration of intravenous contrast. Multiplanar CT image reconstructions and MIPs were obtained to evaluate the vascular anatomy. Carotid stenosis measurements (when applicable) are obtained utilizing NASCET criteria, using the distal internal carotid diameter as the denominator. CONTRAST:  36m OMNIPAQUE IOHEXOL 350 MG/ML SOLN COMPARISON:  Head CT earlier same day.  MRI 05/26/2017. FINDINGS: CTA NECK FINDINGS Aortic arch: Mild aortic atherosclerosis. Branching pattern is normal without origin stenosis. Right carotid system: Common carotid artery widely patent to the bifurcation. Carotid bifurcation is normal without soft or calcified plaque. Cervical ICA is normal. Left carotid system: Common carotid artery widely patent to the bifurcation. Carotid bifurcation is normal without soft or calcified plaque. Cervical ICA is normal. Vertebral arteries: Both vertebral artery origins are widely patent. Both vertebral arteries are normal through the cervical region to the foramen magnum. Skeleton: Ordinary cervical spondylosis. Other neck: No mass or lymphadenopathy. Upper chest: Lung apices are clear except for minimal scarring and emphysema in the right  upper lobe. Review of the MIP images confirms the above findings CTA HEAD FINDINGS Anterior circulation: Both internal carotid arteries are patent through the skull base and siphon regions. Mild siphon atherosclerotic calcification but no stenosis greater than 30%. The anterior and middle cerebral vessels are patent. No large vessel occlusion. No correctable proximal stenosis. No aneurysm or vascular malformation. Posterior circulation: Both vertebral arteries are patent through the foramen magnum to the basilar. No basilar stenosis. Posterior circulation branch vessels are normal. Venous sinuses: Patent and normal. Anatomic variants: None significant. Review of the MIP images confirms the above findings IMPRESSION: Negative study. No atherosclerotic disease at either carotid bifurcation or vertebral artery origin. No intracranial large vessel occlusion. Minimal atherosclerotic change in the carotid siphon regions but without stenosis greater than 30%. Electronically Signed   By: MNelson ChimesM.D.   On: 04/16/2021 17:07   CT HEAD WO CONTRAST (5MM)  Result Date: 04/16/2021 CLINICAL DATA:  Neuro deficit. Acute stroke suspected. Left-sided numbness. Left-sided facial droop. Slurred speech. EXAM: CT HEAD WITHOUT CONTRAST TECHNIQUE: Contiguous axial images were obtained from the base of the skull through the vertex without intravenous contrast. COMPARISON:  May 25, 2017 FINDINGS: Brain: No subdural, epidural, or subarachnoid hemorrhage identified. Ventricles and sulci are normal. There is a  lacunar infarct in the right cerebellar hemisphere new since September 2018. Cerebellum is otherwise normal. Brainstem is normal. Basal cisterns are patent. No mass effect or midline shift. No evidence of acute cortical ischemia or infarct. Vascular: Calcified atherosclerosis in the intracranial carotids. Skull: Normal. Negative for fracture or focal lesion. Sinuses/Orbits: No acute finding. Other: No other abnormalities.  IMPRESSION: 1. There is a small lacunar infarct in the right cerebellar hemisphere new since 2018 but nonacute in appearance. No other acute intracranial abnormalities are identified to explain the patient's symptoms. Electronically Signed   By: Dorise Bullion III M.D.   On: 04/16/2021 15:18   CT Angio Neck W and/or Wo Contrast  Result Date: 04/16/2021 CLINICAL DATA:  Transient ischemic attack. Left-sided numbness. Left-sided facial droop. Slurred speech. Blurred vision. EXAM: CT ANGIOGRAPHY HEAD AND NECK TECHNIQUE: Multidetector CT imaging of the head and neck was performed using the standard protocol during bolus administration of intravenous contrast. Multiplanar CT image reconstructions and MIPs were obtained to evaluate the vascular anatomy. Carotid stenosis measurements (when applicable) are obtained utilizing NASCET criteria, using the distal internal carotid diameter as the denominator. CONTRAST:  30m OMNIPAQUE IOHEXOL 350 MG/ML SOLN COMPARISON:  Head CT earlier same day.  MRI 05/26/2017. FINDINGS: CTA NECK FINDINGS Aortic arch: Mild aortic atherosclerosis. Branching pattern is normal without origin stenosis. Right carotid system: Common carotid artery widely patent to the bifurcation. Carotid bifurcation is normal without soft or calcified plaque. Cervical ICA is normal. Left carotid system: Common carotid artery widely patent to the bifurcation. Carotid bifurcation is normal without soft or calcified plaque. Cervical ICA is normal. Vertebral arteries: Both vertebral artery origins are widely patent. Both vertebral arteries are normal through the cervical region to the foramen magnum. Skeleton: Ordinary cervical spondylosis. Other neck: No mass or lymphadenopathy. Upper chest: Lung apices are clear except for minimal scarring and emphysema in the right upper lobe. Review of the MIP images confirms the above findings CTA HEAD FINDINGS Anterior circulation: Both internal carotid arteries are patent  through the skull base and siphon regions. Mild siphon atherosclerotic calcification but no stenosis greater than 30%. The anterior and middle cerebral vessels are patent. No large vessel occlusion. No correctable proximal stenosis. No aneurysm or vascular malformation. Posterior circulation: Both vertebral arteries are patent through the foramen magnum to the basilar. No basilar stenosis. Posterior circulation branch vessels are normal. Venous sinuses: Patent and normal. Anatomic variants: None significant. Review of the MIP images confirms the above findings IMPRESSION: Negative study. No atherosclerotic disease at either carotid bifurcation or vertebral artery origin. No intracranial large vessel occlusion. Minimal atherosclerotic change in the carotid siphon regions but without stenosis greater than 30%. Electronically Signed   By: MNelson ChimesM.D.   On: 04/16/2021 17:07   MR BRAIN WO CONTRAST  Result Date: 04/17/2021 CLINICAL DATA:  Neurological deficit, acute, stroke suspected. EXAM: MRI HEAD WITHOUT CONTRAST TECHNIQUE: Multiplanar, multiecho pulse sequences of the brain and surrounding structures were obtained without intravenous contrast. COMPARISON:  CT studies done yesterday. FINDINGS: Brain: Diffusion imaging does not show any acute or subacute infarction. No abnormality affects the brainstem. Several areas of old infarction in the inferior cerebellum on the right consistent with old PICA insult. Cerebral hemispheres are normal without atrophy, old or recent infarction, mass lesion, hemorrhage, hydrocephalus or extra-axial collection. Vascular: Major vessels at the base of the brain show flow. Skull and upper cervical spine: Negative Sinuses/Orbits: Clear/normal Other: None IMPRESSION: No acute or subacute finding. 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 Otherwise normal study. Electronically Signed   By: Nelson Chimes M.D.   On:  04/17/2021 13:00   ECHOCARDIOGRAM COMPLETE  Result Date: 04/17/2021    ECHOCARDIOGRAM REPORT   Patient Name:   Nathaniel Dunlap Date of Exam: 04/17/2021 Medical Rec #:  [id number]     Height:       71.0 in Accession #:    [id number]    Weight:       174.2 lb Date of Birth:  [birth year]      BSA:          1.988 m Patient Age:    11 years      BP:           122/79 mmHg Patient Gender: M             HR:           67 bpm. Exam Location:  Inpatient Procedure: 2D Echo Indications:    TIA  History:        Patient has prior history of Echocardiogram examinations, most                 recent 05/26/2017. Risk Factors:Dyslipidemia and Current Smoker.  Sonographer:    Johny Chess RDCS Referring Phys: [place]  1. Left ventricular ejection fraction, by estimation, is 70 to 75%. The left ventricle has hyperdynamic function. The left ventricle has no regional wall motion abnormalities. Left ventricular diastolic parameters were normal.  2. Right ventricular systolic function is normal. The right ventricular size is normal. Tricuspid regurgitation signal is inadequate for assessing PA pressure.  3. The mitral valve is normal in structure. No evidence of mitral valve regurgitation.  4. The aortic valve is tricuspid. Aortic valve regurgitation is not visualized.  5. The inferior vena cava is dilated in size with >50% respiratory variability, suggesting right atrial pressure of 8 mmHg.  6. No doppler evidence of shunt, but cannot exclude PFO due to atrial septal aneurysm. Consider bubble study. Comparison(s): Prior images unable to be directly viewed, comparison made by report only. No prior study in our system. Conclusion(s)/Recommendation(s): No intracardiac source of embolism detected on this transthoracic study. A transesophageal echocardiogram is recommended to exclude cardiac source of embolism if clinically indicated. FINDINGS  Left Ventricle: Left ventricular ejection fraction, by estimation, is 70 to  75%. The left ventricle has hyperdynamic function. The left ventricle has no regional wall motion abnormalities. The left ventricular internal cavity size was normal in size. There is no left ventricular hypertrophy. Left ventricular diastolic parameters were normal. Right Ventricle: The right ventricular size is normal. No increase in right ventricular wall thickness. Right ventricular systolic function is normal. Tricuspid regurgitation signal is inadequate for assessing PA pressure. Left Atrium: Left atrial size was normal in size. Right Atrium: Right atrial size was normal in size. Pericardium: There is no evidence of pericardial effusion. Mitral Valve: The mitral valve is normal in structure. No evidence of mitral valve regurgitation. Tricuspid Valve: The tricuspid valve is grossly normal. Tricuspid valve regurgitation is trivial. Aortic Valve: The aortic valve is tricuspid. Aortic valve regurgitation is not visualized. Pulmonic Valve: The pulmonic valve was normal in structure. Pulmonic valve regurgitation is not visualized. Aorta: The aortic root and ascending aorta are structurally normal, with no evidence of dilitation. Venous: The inferior vena cava is dilated in size with greater than 50% respiratory variability, suggesting right atrial pressure of 8  mmHg. IAS/Shunts: The interatrial septum is aneurysmal. No doppler evidence of shunt, but cannot exclude PFO due to atrial septal aneurysm. Consider bubble study.  LEFT VENTRICLE PLAX 2D LVIDd:         4.80 cm  Diastology LVIDs:         2.70 cm  LV e' medial:    8.49 cm/s LV PW:         0.90 cm  LV E/e' medial:  12.4 LV IVS:        1.00 cm  LV e' lateral:   12.50 cm/s LVOT diam:     1.70 cm  LV E/e' lateral: 8.4 LV SV:         65 LV SV Index:   33 LVOT Area:     2.27 cm  RIGHT VENTRICLE             IVC RV S prime:     12.40 cm/s  IVC diam: 2.40 cm TAPSE (M-mode): 2.1 cm LEFT ATRIUM             Index       RIGHT ATRIUM           Index LA diam:        3.40 cm  1.71 cm/m  RA Area:     12.20 cm LA Vol (A2C):   41.4 ml 20.82 ml/m RA Volume:   27.20 ml  13.68 ml/m LA Vol (A4C):   41.7 ml 20.97 ml/m LA Biplane Vol: 42.9 ml 21.58 ml/m  AORTIC VALVE LVOT Vmax:   145.00 cm/s LVOT Vmean:  89.300 cm/s LVOT VTI:    0.288 m  AORTA Ao Root diam: 3.00 cm Ao Asc diam:  2.80 cm MITRAL VALVE MV Area (PHT): 3.21 cm     SHUNTS MV Decel Time: 236 msec     Systemic VTI:  0.29 m MV E velocity: 105.00 cm/s  Systemic Diam: 1.70 cm MV A velocity: 89.30 cm/s MV E/A ratio:  1.18 Lyman Bishop MD Electronically signed by Lyman Bishop MD Signature Date/Time: 04/17/2021/6:15:06 PM    Final     Microbiology: Recent Results (from the past 240 hour(s))  Resp Panel by RT-PCR (Flu A&B, Covid) Nasopharyngeal Swab     Status: None   Collection Time: 04/16/21  1:56 PM   Specimen: Nasopharyngeal Swab; Nasopharyngeal(NP) swabs in vial transport medium  Result Value Ref Range Status   SARS Coronavirus 2 by RT PCR NEGATIVE NEGATIVE Final    Comment: (NOTE) SARS-CoV-2 target nucleic acids are NOT DETECTED.  The SARS-CoV-2 RNA is generally detectable in upper respiratory specimens during the acute phase of infection. The lowest concentration of SARS-CoV-2 viral copies this assay can detect is 138 copies/mL. A negative result does not preclude SARS-Cov-2 infection and should not be used as the sole basis for treatment or other patient management decisions. A negative result may occur with  improper specimen collection/handling, submission of specimen other than nasopharyngeal swab, presence of viral mutation(s) within the areas targeted by this assay, and inadequate number of viral copies(<138 copies/mL). A negative result must be combined with clinical observations, patient history, and epidemiological information. The expected result is Negative.  Fact Sheet for Patients:  EntrepreneurPulse.com.au  Fact Sheet for Healthcare Providers:   IncredibleEmployment.be  This test is no t yet approved or cleared by the [place] FDA and  has been authorized for detection and/or diagnosis of SARS-CoV-2 by FDA under an Emergency Use Authorization (EUA). This EUA will remain  in  effect (meaning this test can be used) for the duration of the COVID-19 declaration under Section 564(b)(1) of the Act, 21 U.S.C.section 360bbb-3(b)(1), unless the authorization is terminated  or revoked sooner.       Influenza A by PCR NEGATIVE NEGATIVE Final   Influenza B by PCR NEGATIVE NEGATIVE Final    Comment: (NOTE) The Xpert Xpress SARS-CoV-2/FLU/RSV plus assay is intended as an aid in the diagnosis of influenza from Nasopharyngeal swab specimens and should not be used as a sole basis for treatment. Nasal washings and aspirates are unacceptable for Xpert Xpress SARS-CoV-2/FLU/RSV testing.  Fact Sheet for Patients: EntrepreneurPulse.com.au  Fact Sheet for Healthcare Providers: IncredibleEmployment.be  This test is not yet approved or cleared by the [place] FDA and has been authorized for detection and/or diagnosis of SARS-CoV-2 by FDA under an Emergency Use Authorization (EUA). This EUA will remain in effect (meaning this test can be used) for the duration of the COVID-19 declaration under Section 564(b)(1) of the Act, 21 U.S.C. section 360bbb-3(b)(1), unless the authorization is terminated or revoked.  Performed at [facility], [address] [place], [place] [address]      Labs: CBC: Recent Labs  Lab 04/16/21 1405  WBC 6.2  NEUTROABS 3.7  HGB 15.5  HCT 43.9  MCV 93.8  PLT 123456   Basic Metabolic Panel: Recent Labs  Lab 04/16/21 1405  NA 132*  K 3.4*  CL 104  CO2 22  GLUCOSE 192*  BUN 16  CREATININE 1.02  CALCIUM 8.7*   Liver Function Tests: Recent Labs  Lab 04/16/21 1405  AST 22  ALT 25  ALKPHOS 68  BILITOT 0.6  PROT 8.0  ALBUMIN 4.4    CBG: No results for input(s): GLUCAP in the last 168 hours.  Time spent: 35 minutes  Signed:  Berle Mull  [facility] 04/17/2021

## 2021-06-23 ENCOUNTER — Encounter: Payer: Self-pay | Admitting: Radiology

## 2021-06-23 ENCOUNTER — Emergency Department: Payer: BLUE CROSS/BLUE SHIELD

## 2021-06-23 ENCOUNTER — Other Ambulatory Visit: Payer: Self-pay

## 2021-06-23 ENCOUNTER — Emergency Department
Admission: EM | Admit: 2021-06-23 | Discharge: 2021-06-23 | Disposition: A | Discharge status: Home / Self Care | Payer: BLUE CROSS/BLUE SHIELD | Attending: Emergency Medicine | Admitting: Emergency Medicine

## 2021-06-23 DIAGNOSIS — Z8673 Personal history of transient ischemic attack (TIA), and cerebral infarction without residual deficits: Secondary | ICD-10-CM | POA: Diagnosis not present

## 2021-06-23 DIAGNOSIS — R29810 Facial weakness: Secondary | ICD-10-CM | POA: Diagnosis present

## 2021-06-23 DIAGNOSIS — Z20822 Contact with and (suspected) exposure to covid-19: Secondary | ICD-10-CM | POA: Diagnosis not present

## 2021-06-23 DIAGNOSIS — G43109 Migraine with aura, not intractable, without status migrainosus: Secondary | ICD-10-CM | POA: Diagnosis not present

## 2021-06-23 DIAGNOSIS — R2990 Unspecified symptoms and signs involving the nervous system: Secondary | ICD-10-CM

## 2021-06-23 DIAGNOSIS — F17210 Nicotine dependence, cigarettes, uncomplicated: Secondary | ICD-10-CM | POA: Insufficient documentation

## 2021-06-23 LAB — COMPREHENSIVE METABOLIC PANEL
ALT: 20 U/L (ref 0–44)
AST: 28 U/L (ref 15–41)
Albumin: 4.3 g/dL (ref 3.5–5.0)
Alkaline Phosphatase: 57 U/L (ref 38–126)
Anion gap: 8 (ref 5–15)
BUN: 18 mg/dL (ref 6–20)
CO2: 23 mmol/L (ref 22–32)
Calcium: 9 mg/dL (ref 8.9–10.3)
Chloride: 108 mmol/L (ref 98–111)
Creatinine, Ser: 1.06 mg/dL (ref 0.61–1.24)
GFR, Estimated: >60 mL/min (ref >60)
Glucose, Bld: 114 mg/dL — ABNORMAL HIGH (ref 70–99)
Potassium: 4.5 mmol/L (ref 3.5–5.1)
Sodium: 139 mmol/L (ref 135–145)
Total Bilirubin: 0.7 mg/dL (ref 0.3–1.2)
Total Protein: 7.6 g/dL (ref 6.5–8.1)

## 2021-06-23 LAB — RESP PANEL BY RT-PCR (FLU A&B, COVID) ARPGX2
Influenza A by PCR: NEGATIVE
Influenza B by PCR: NEGATIVE
SARS Coronavirus 2 by RT PCR: NEGATIVE

## 2021-06-23 LAB — TROPONIN I (HIGH SENSITIVITY)
Troponin I (High Sensitivity): 3 ng/L (ref <18)
Troponin I (High Sensitivity): 5 ng/L (ref <18)

## 2021-06-23 LAB — DIFFERENTIAL
Abs Immature Granulocytes: 0.02 10*3/uL (ref 0.00–0.07)
Basophils Absolute: 0 10*3/uL (ref 0.0–0.1)
Basophils Relative: 1 %
Eosinophils Absolute: 0.3 10*3/uL (ref 0.0–0.5)
Eosinophils Relative: 6 %
Immature Granulocytes: 0 %
Lymphocytes Relative: 28 %
Lymphs Abs: 1.4 10*3/uL (ref 0.7–4.0)
Monocytes Absolute: 0.4 10*3/uL (ref 0.1–1.0)
Monocytes Relative: 8 %
Neutro Abs: 2.8 10*3/uL (ref 1.7–7.7)
Neutrophils Relative %: 57 %

## 2021-06-23 LAB — PROTIME-INR
INR: 1 (ref 0.8–1.2)
Prothrombin Time: 12.6 seconds (ref 11.4–15.2)

## 2021-06-23 LAB — CBC
HCT: 43.6 % (ref 39.0–52.0)
Hemoglobin: 15.5 g/dL (ref 13.0–17.0)
MCH: 33.5 pg (ref 26.0–34.0)
MCHC: 35.6 g/dL (ref 30.0–36.0)
MCV: 94.2 fL (ref 80.0–100.0)
Platelets: 183 10*3/uL (ref 150–400)
RBC: 4.63 MIL/uL (ref 4.22–5.81)
RDW: 12.3 % (ref 11.5–15.5)
WBC: 4.9 10*3/uL (ref 4.0–10.5)
nRBC: 0 % (ref 0.0–0.2)

## 2021-06-23 LAB — CBG MONITORING, ED: Glucose-Capillary: 111 mg/dL — ABNORMAL HIGH (ref 70–99)

## 2021-06-23 LAB — ETHANOL: Alcohol, Ethyl (B): <10 mg/dL (ref <10)

## 2021-06-23 LAB — APTT: aPTT: 26 seconds (ref 24–36)

## 2021-06-23 MED ORDER — PROCHLORPERAZINE EDISYLATE 10 MG/2ML IJ SOLN
10.0000 mg | Freq: Four times a day (QID) | INTRAMUSCULAR | Status: DC
  Administered 2021-06-23: 10 mg via INTRAVENOUS
  Filled 2021-06-23: qty 2

## 2021-06-23 MED ORDER — MAGNESIUM SULFATE 2 GM/50ML IV SOLN
2.0000 g | Freq: Two times a day (BID) | INTRAVENOUS | Status: DC
  Administered 2021-06-23: 2 g via INTRAVENOUS
  Filled 2021-06-23: qty 50

## 2021-06-23 MED ORDER — DIPHENHYDRAMINE HCL 50 MG/ML IJ SOLN
12.5000 mg | Freq: Four times a day (QID) | INTRAMUSCULAR | Status: DC
  Administered 2021-06-23: 12.5 mg via INTRAVENOUS
  Filled 2021-06-23: qty 1

## 2021-06-23 MED ORDER — IOHEXOL 350 MG/ML SOLN
75.0000 mL | Freq: Once | INTRAVENOUS | Status: AC | PRN
  Administered 2021-06-23: 75 mL via INTRAVENOUS

## 2021-06-23 MED ORDER — SODIUM CHLORIDE 0.9 % IV SOLN: Freq: Once | INTRAVENOUS | Status: AC

## 2021-06-23 MED ORDER — ACETAMINOPHEN 325 MG PO TABS
650.0000 mg | ORAL_TABLET | Freq: Four times a day (QID) | ORAL | Status: DC
  Administered 2021-06-23: 650 mg via ORAL
  Filled 2021-06-23: qty 2

## 2021-06-23 MED ORDER — KETOROLAC TROMETHAMINE 30 MG/ML IJ SOLN
30.0000 mg | Freq: Four times a day (QID) | INTRAMUSCULAR | Status: DC
  Administered 2021-06-23: 30 mg via INTRAVENOUS
  Filled 2021-06-23: qty 1

## 2021-06-23 NOTE — ED Notes (Signed)
EDP at bedside for reevaluation.
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
## 2021-06-23 NOTE — ED Notes (Signed)
Neuro cart in room at this time Alwyn Pea to provide all needed information.

## 2021-06-23 NOTE — Code Documentation (Signed)
Stroke Response Nurse Documentation Code Documentation  Nathaniel Dunlap is a 55 y.o. male arriving to [facility] ED via [facility] on 06/23/21. Code stroke was activated by ED staff.   Patient from work where he was LKW at 1030 and now complaining of left sided facial droop and feeling dizzy with decreased sensation to left sided face.   Stroke team at the bedside on patient arrival. Labs drawn and patient cleared for CT by Dr. Ellender Hose. Patient to CT with team. NIHSS 2, see documentation for details and code stroke times. The following imaging was completed:  CT, CTA head and neck.  Patient is not a candidate for IV Thrombolytic due to symptoms being intermittent x 1 week.     Bedside handoff with ED RN Caryl Pina.    Velta Addison Stroke Response RN

## 2021-06-23 NOTE — Discharge Instructions (Addendum)
Continue your medications  Follow-up with a Neurologist in the next 1-2 weeks. You can call the number above to set up an appointment.

## 2021-06-23 NOTE — Progress Notes (Signed)
   06/23/21 1145  Clinical Encounter Type  Visited With Patient not available  Visit Type Initial;Code  Spiritual Encounters  Spiritual Needs Other (Comment) (not assessed)  Chaplain Burris responded to a Code Stroke. Upon arrival in ED chaplain was notified that Pt had just been taken to CT. Daryel November inquired with ED front desk, [facility] front desk, and also checked CT waiting area for family members who may be present and in need of support. No one was located, but staff informed that the on-call chaplain would remain available; page [phone]

## 2021-06-23 NOTE — ED Notes (Signed)
Pt taken to CT by RN Caryl Pina and Minette Brine

## 2021-06-23 NOTE — ED Notes (Signed)
MD Bland Span at stretcher assessing pt at this time. Per MD Bland Span call CODE STROKE.  RN Caryl Pina and Minette Brine informed and CBG to be checked stat and then pt taken to CT room 1.

## 2021-06-23 NOTE — ED Triage Notes (Addendum)
Pt comes via EMS from work with c/o left sided facial droop and left sided numbness. Pt also states CP and headache. LKW hour ago about 10:26 at work.  18 g in place.

## 2021-06-23 NOTE — Consult Note (Signed)
CODE STROKE- PHARMACY COMMUNICATION   Time CODE STROKE called/page received:[id number]  Time response to CODE STROKE was made (in person or via phone): 1136  TNK not given since pt has been experiencing stroke symptoms for past week.   Past Medical History:  Diagnosis Date   High cholesterol    Pancreatitis    TIA (transient ischemic attack)    Tobacco use    Prior to Admission medications   Medication Sig Start Date End Date Taking? Authorizing Provider  aspirin EC 81 MG tablet Take 1 tablet (81 mg total) by mouth daily. Swallow whole. 04/17/21 04/17/22  Lavina Hamman, MD  atorvastatin (LIPITOR) 80 MG tablet Take 1 tablet (80 mg total) by mouth daily. 04/18/21   Lavina Hamman, MD  famotidine (PEPCID) 20 MG tablet Take 1 tablet (20 mg total) by mouth 2 (two) times daily. 03/18/20   Evalee Jefferson, PA-C    Narda Rutherford, PharmD Pharmacy Resident  06/23/2021 12:26 PM

## 2021-06-23 NOTE — ED Notes (Signed)
Called CODE STROKE to [facility] spoke with Chad

## 2021-06-23 NOTE — ED Notes (Signed)
Teleneuro cart in room

## 2021-10-17 ENCOUNTER — Ambulatory Visit
Admission: EM | Admit: 2021-10-17 | Discharge: 2021-10-17 | Disposition: A | Discharge status: Home / Self Care | Payer: BLUE CROSS/BLUE SHIELD | Attending: Urgent Care | Admitting: Urgent Care

## 2021-10-17 ENCOUNTER — Ambulatory Visit (INDEPENDENT_AMBULATORY_CARE_PROVIDER_SITE_OTHER): Payer: BLUE CROSS/BLUE SHIELD

## 2021-10-17 ENCOUNTER — Other Ambulatory Visit: Payer: Self-pay

## 2021-10-17 DIAGNOSIS — M79675 Pain in left toe(s): Secondary | ICD-10-CM

## 2021-10-17 DIAGNOSIS — M79672 Pain in left foot: Secondary | ICD-10-CM

## 2021-10-17 DIAGNOSIS — M109 Gout, unspecified: Secondary | ICD-10-CM | POA: Diagnosis not present

## 2021-10-17 MED ORDER — PREDNISONE 20 MG PO TABS: ORAL_TABLET | ORAL | 0 refills | Status: DC
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
## 2021-10-17 NOTE — ED Triage Notes (Signed)
Pt reports swelling, pain and redness in left big toe x 2 days.

## 2022-02-17 ENCOUNTER — Ambulatory Visit: Payer: BLUE CROSS/BLUE SHIELD | Admitting: Family Medicine

## 2022-02-17 ENCOUNTER — Encounter: Payer: Self-pay | Admitting: Family Medicine

## 2022-02-17 VITALS — BP 148/82 | HR 87 | Ht 71.0 in | Wt 175.0 lb

## 2022-02-17 DIAGNOSIS — E559 Vitamin D deficiency, unspecified: Secondary | ICD-10-CM | POA: Diagnosis not present

## 2022-02-17 DIAGNOSIS — Z1159 Encounter for screening for other viral diseases: Secondary | ICD-10-CM

## 2022-02-17 DIAGNOSIS — E782 Mixed hyperlipidemia: Secondary | ICD-10-CM

## 2022-02-17 DIAGNOSIS — F1090 Alcohol use, unspecified, uncomplicated: Secondary | ICD-10-CM

## 2022-02-17 DIAGNOSIS — Z720 Tobacco use: Secondary | ICD-10-CM

## 2022-02-17 DIAGNOSIS — Z0001 Encounter for general adult medical examination with abnormal findings: Secondary | ICD-10-CM

## 2022-02-17 DIAGNOSIS — K921 Melena: Secondary | ICD-10-CM | POA: Diagnosis not present

## 2022-02-17 DIAGNOSIS — Z23 Encounter for immunization: Secondary | ICD-10-CM | POA: Diagnosis not present

## 2022-02-17 DIAGNOSIS — R7301 Impaired fasting glucose: Secondary | ICD-10-CM

## 2022-02-17 DIAGNOSIS — Z1211 Encounter for screening for malignant neoplasm of colon: Secondary | ICD-10-CM | POA: Diagnosis not present

## 2022-02-17 DIAGNOSIS — E785 Hyperlipidemia, unspecified: Secondary | ICD-10-CM

## 2022-02-17 DIAGNOSIS — Z789 Other specified health status: Secondary | ICD-10-CM

## 2022-02-17 DIAGNOSIS — R030 Elevated blood-pressure reading, without diagnosis of hypertension: Secondary | ICD-10-CM

## 2022-02-17 MED ORDER — ATORVASTATIN CALCIUM 80 MG PO TABS: 80.0000 mg | ORAL_TABLET | Freq: Every day | ORAL | 0 refills | Status: DC

## 2022-02-17 NOTE — Patient Instructions (Addendum)
I appreciate the opportunity to provide care to you today!    Follow up:  1 months  Labs: please stop by the lab today to get your blood drawn (CBC, CMP, TSH, Lipid profile, HgA1c, Vit D)  Screening: Hep C  Please pick up your medications at the pharmacy   Thank you for getting your Tdap and Shingles vaccine  Referrals today- GI   Please continue to a heart-healthy diet and increase your physical activities. Try to exercise for 54mns at least three times a week.      It was a pleasure to see you and I look forward to continuing to work together on your health and well-being. Please do not hesitate to call the office if you need care or have questions about your care.   Have a wonderful day and week. With Gratitude, GAlvira MondayMSN, FNP-BC
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
## 2022-02-17 NOTE — Assessment & Plan Note (Signed)
Smoking 2 ppd of cigarettes  Smokes about 2 pack/day  Asked about quitting: confirms that he currently smokes cigarettes Advise to quit smoking: Educated about QUITTING to reduce the risk of cancer, cardio and cerebrovascular disease. Assess willingness: Unwilling to quit at this time, but is working on cutting back. Assist with counseling and pharmacotherapy: Counseled for 5 minutes and literature provided. Arrange for follow up: follow up in 3 months and continue to offer help.

## 2022-02-17 NOTE — Assessment & Plan Note (Signed)
Referral made to GI.

## 2022-02-17 NOTE — Assessment & Plan Note (Signed)
Refilled Lipitor

## 2022-02-17 NOTE — Assessment & Plan Note (Signed)
He reports drinking 12 packs daily since his breakup with his girlfriend on 02/03/22 Encouraged to decrease his alcohol intake  Recommended daily intake is <2 drinks daily

## 2022-02-18 DIAGNOSIS — R030 Elevated blood-pressure reading, without diagnosis of hypertension: Secondary | ICD-10-CM | POA: Insufficient documentation

## 2022-02-18 LAB — LIPID PANEL
Chol/HDL Ratio: 5.6 ratio — ABNORMAL HIGH (ref 0.0–5.0)
Cholesterol, Total: 218 mg/dL — ABNORMAL HIGH (ref 100–199)
HDL: 39 mg/dL — ABNORMAL LOW (ref >39)
LDL Chol Calc (NIH): 103 mg/dL — ABNORMAL HIGH (ref 0–99)
Triglycerides: 450 mg/dL — ABNORMAL HIGH (ref 0–149)
VLDL Cholesterol Cal: 76 mg/dL — ABNORMAL HIGH (ref 5–40)

## 2022-02-18 LAB — CBC WITH DIFFERENTIAL/PLATELET
Basophils Absolute: 0.1 10*3/uL (ref 0.0–0.2)
Basos: 1 %
EOS (ABSOLUTE): 0.3 10*3/uL (ref 0.0–0.4)
Eos: 5 %
Hematocrit: 48.1 % (ref 37.5–51.0)
Hemoglobin: 16.5 g/dL (ref 13.0–17.7)
Immature Grans (Abs): 0 10*3/uL (ref 0.0–0.1)
Immature Granulocytes: 1 %
Lymphocytes Absolute: 1.3 10*3/uL (ref 0.7–3.1)
Lymphs: 22 %
MCH: 32.1 pg (ref 26.6–33.0)
MCHC: 34.3 g/dL (ref 31.5–35.7)
MCV: 94 fL (ref 79–97)
Monocytes Absolute: 0.5 10*3/uL (ref 0.1–0.9)
Monocytes: 8 %
Neutrophils Absolute: 3.8 10*3/uL (ref 1.4–7.0)
Neutrophils: 63 %
Platelets: 213 10*3/uL (ref 150–450)
RBC: 5.14 x10E6/uL (ref 4.14–5.80)
RDW: 12.8 % (ref 11.6–15.4)
WBC: 6 10*3/uL (ref 3.4–10.8)

## 2022-02-18 LAB — CMP14+EGFR
ALT: 19 IU/L (ref 0–44)
AST: 16 IU/L (ref 0–40)
Albumin/Globulin Ratio: 1.8 (ref 1.2–2.2)
Albumin: 4.9 g/dL (ref 3.8–4.9)
Alkaline Phosphatase: 69 IU/L (ref 44–121)
BUN/Creatinine Ratio: 15 (ref 9–20)
BUN: 16 mg/dL (ref 6–24)
Bilirubin Total: 0.5 mg/dL (ref 0.0–1.2)
CO2: 20 mmol/L (ref 20–29)
Calcium: 9.8 mg/dL (ref 8.7–10.2)
Chloride: 101 mmol/L (ref 96–106)
Creatinine, Ser: 1.06 mg/dL (ref 0.76–1.27)
Globulin, Total: 2.7 g/dL (ref 1.5–4.5)
Glucose: 123 mg/dL — ABNORMAL HIGH (ref 70–99)
Potassium: 4.6 mmol/L (ref 3.5–5.2)
Sodium: 138 mmol/L (ref 134–144)
Total Protein: 7.6 g/dL (ref 6.0–8.5)
eGFR: 82 mL/min/{1.73_m2} (ref >59)

## 2022-02-18 LAB — HEPATITIS C ANTIBODY: Hep C Virus Ab: NONREACTIVE

## 2022-02-18 LAB — TSH+FREE T4
Free T4: 1.26 ng/dL (ref 0.82–1.77)
TSH: 1.14 u[IU]/mL (ref 0.450–4.500)

## 2022-02-18 LAB — HEMOGLOBIN A1C
Est. average glucose Bld gHb Est-mCnc: 126 mg/dL
Hgb A1c MFr Bld: 6 % — ABNORMAL HIGH (ref 4.8–5.6)

## 2022-02-18 LAB — VITAMIN D 25 HYDROXY (VIT D DEFICIENCY, FRACTURES): Vit D, 25-Hydroxy: 17.7 ng/mL — ABNORMAL LOW (ref 30.0–100.0)

## 2022-02-18 NOTE — Assessment & Plan Note (Signed)
-  BP elevated today -encouraged heart-healthy diet and increased physical activities -recommended monitoring BP daily and bringing ambulatory readings at his next appointment -reviewed Dash's eating plan and limiting salt intake -encouraged exercising X3 a week  for 30 mins

## 2022-02-21 ENCOUNTER — Ambulatory Visit: Payer: BLUE CROSS/BLUE SHIELD | Admitting: Family Medicine

## 2022-02-22 ENCOUNTER — Telehealth: Payer: Self-pay | Admitting: Internal Medicine

## 2022-02-22 NOTE — Telephone Encounter (Signed)
Pt is aware that we received referral to schedule colonoscopy and he will be receiving a questionnaire to complete and get back to [place]. He asked to mail to a different address. [address], [place], [place] [address]

## 2022-02-22 NOTE — Telephone Encounter (Signed)
I don't see any letters mailed. Fowarding to Lelon Frohlich so she is aware

## 2022-02-23 ENCOUNTER — Encounter: Payer: Self-pay | Admitting: *Deleted

## 2022-02-23 NOTE — Telephone Encounter (Signed)
Questionnaire has been mailed to patient

## 2022-02-23 NOTE — Progress Notes (Signed)
Please inform the patient that his cholesterol is elevated, he has prediabetes, and his Vit. D is low.  I recommend continuing his cholesterol medicine and taking  OTCFish oil '2000mg'$  BID for his cholesterol.   Please advise him to take OTC vit D 5000iu daily for his low Vit D levels.  I recommend low carbs, sugar, and calorie diet for his prediabetes.

## 2022-03-01 ENCOUNTER — Encounter: Payer: Self-pay | Admitting: *Deleted

## 2022-03-01 NOTE — Patient Instructions (Signed)
Referring MD/PCP: Alvira Monday  Procedure: Colonoscopy  Has patient had this procedure before?  no  If so, when, by whom and where?    Is there a family history of colon cancer?  Yes, father  Who?  What age when diagnosed?    Is patient diabetic? If yes, Type 1 or Type 2   no      Does patient have prosthetic heart valve or mechanical valve?  no  Do you have a pacemaker/defibrillator?  no  Has patient ever had endocarditis/atrial fibrillation? no  Does patient use oxygen? no  Has patient had joint replacement within last 12 months?  no  Is patient constipated or do they take laxatives? no  Does patient have a history of alcohol/drug use?  no  Have you had a stroke/heart attack last 6 mths? no  Do you take medicine for weight loss?  no  Is patient on blood thinner such as Coumadin, Plavix and/or Aspirin? no  Medications:  Current Outpatient Medications on File Prior to Visit  Medication Sig Dispense Refill   aspirin EC 81 MG tablet Take 1 tablet (81 mg total) by mouth daily. Swallow whole. 150 tablet 2   atorvastatin (LIPITOR) 80 MG tablet Take 1 tablet (80 mg total) by mouth daily. 30 tablet 0   famotidine (PEPCID) 20 MG tablet Take 1 tablet (20 mg total) by mouth 2 (two) times daily. (Patient not taking: Reported on 06/23/2021) 30 tablet 0   predniSONE (DELTASONE) 20 MG tablet Take 2 tablets daily with breakfast. 10 tablet 0   No current facility-administered medications on file prior to visit.     Allergies:  Allergies  Allergen Reactions   Other Anaphylaxis    cats     Estimated body mass index is 24.41 kg/m as calculated from the following:   Height as of 02/17/22: '5\' 11"'$  (1.803 m).   Weight as of 02/17/22: 175 lb (79.4 kg).

## 2022-03-01 NOTE — Progress Notes (Signed)
Patient reported hematochezia and rectal pain at his office visit with PCP on 6/16.  He needs an in person office visit with any available APP.

## 2022-03-02 ENCOUNTER — Encounter: Payer: Self-pay | Admitting: Internal Medicine
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
## 2022-03-27 ENCOUNTER — Encounter: Payer: Self-pay | Admitting: Family Medicine

## 2022-03-27 ENCOUNTER — Ambulatory Visit: Payer: BLUE CROSS/BLUE SHIELD | Admitting: Family Medicine

## 2022-03-27 VITALS — BP 124/78 | HR 66 | Ht 71.0 in | Wt 178.2 lb

## 2022-03-27 DIAGNOSIS — R030 Elevated blood-pressure reading, without diagnosis of hypertension: Secondary | ICD-10-CM

## 2022-03-27 DIAGNOSIS — E569 Vitamin deficiency, unspecified: Secondary | ICD-10-CM

## 2022-03-27 DIAGNOSIS — K921 Melena: Secondary | ICD-10-CM

## 2022-03-27 DIAGNOSIS — Z720 Tobacco use: Secondary | ICD-10-CM | POA: Diagnosis not present

## 2022-03-27 DIAGNOSIS — E785 Hyperlipidemia, unspecified: Secondary | ICD-10-CM | POA: Diagnosis not present

## 2022-03-27 DIAGNOSIS — Z789 Other specified health status: Secondary | ICD-10-CM

## 2022-03-27 MED ORDER — VITAMIN D (ERGOCALCIFEROL) 1.25 MG (50000 UNIT) PO CAPS: 50000.0000 [IU] | ORAL_CAPSULE | ORAL | 1 refills | Status: DC

## 2022-03-27 MED ORDER — ATORVASTATIN CALCIUM 80 MG PO TABS: 80.0000 mg | ORAL_TABLET | Freq: Every day | ORAL | 3 refills | Status: DC

## 2022-03-27 MED ORDER — VARENICLINE TARTRATE 0.5 MG PO TABS: ORAL_TABLET | ORAL | 0 refills | Status: DC

## 2022-03-27 NOTE — Assessment & Plan Note (Signed)
No recent episodes Reports following up with GI on 03/28/22

## 2022-03-27 NOTE — Assessment & Plan Note (Signed)
smokes 1 ppd Reports that he wants to quit smoking as soon as possible Will start patient on chantix

## 2022-03-27 NOTE — Patient Instructions (Addendum)
I appreciate the opportunity to provide care to you today!    Follow up:  4 months CPE  Please pick up your medications at the pharmacy   Please continue to a heart-healthy diet and increase your physical activities. Try to exercise for 27mns at least three times a week.   Chantex: 0.5 mg PO once daily on Days 1 through 3, then 0.5 mg PO twice daily on Days 4 through 7, and finally 1 mg PO twice daily on day 8 until end of treatment. Duration of treatment should be 12 weeks.    Please start taking fish oil '2000mg'$  BID   It was a pleasure to see you and I look forward to continuing to work together on your health and well-being. Please do not hesitate to call the office if you need care or have questions about your care.   Have a wonderful day and week. With Gratitude, GAlvira MondayMSN, FNP-BC

## 2022-03-27 NOTE — Assessment & Plan Note (Addendum)
Reports alcohol cessation

## 2022-03-27 NOTE — Assessment & Plan Note (Signed)
Controlled Pt is back with her girlfriend and reports to have quit drinking alcohol
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
## 2022-03-28 ENCOUNTER — Encounter: Payer: Self-pay | Admitting: *Deleted

## 2022-03-28 ENCOUNTER — Ambulatory Visit: Payer: BLUE CROSS/BLUE SHIELD | Admitting: Gastroenterology

## 2022-03-28 ENCOUNTER — Encounter: Payer: Self-pay | Admitting: Gastroenterology

## 2022-03-28 VITALS — BP 128/74 | HR 63 | Temp 97.8°F | Ht 71.0 in | Wt 180.2 lb

## 2022-03-28 DIAGNOSIS — Z8719 Personal history of other diseases of the digestive system: Secondary | ICD-10-CM

## 2022-03-28 DIAGNOSIS — Z125 Encounter for screening for malignant neoplasm of prostate: Secondary | ICD-10-CM

## 2022-03-28 DIAGNOSIS — Z1211 Encounter for screening for malignant neoplasm of colon: Secondary | ICD-10-CM | POA: Diagnosis not present

## 2022-03-28 DIAGNOSIS — R197 Diarrhea, unspecified: Secondary | ICD-10-CM

## 2022-03-28 DIAGNOSIS — R634 Abnormal weight loss: Secondary | ICD-10-CM

## 2022-03-28 DIAGNOSIS — Z800 Family history of malignant neoplasm of digestive organs: Secondary | ICD-10-CM

## 2022-03-28 MED ORDER — CLENPIQ 10-3.5-12 MG-GM -GM/175ML PO SOLN: 1.0000 | ORAL | 0 refills | Status: DC

## 2022-03-28 NOTE — Patient Instructions (Addendum)
We are scheduling you for colonoscopy in near future with Dr. Abbey Chatters.  For your intermittent diarrhea if it becomes more frequent you may use Imodium over-the-counter as needed.  I have ordered PSA for you have completed at [place] which is across the street from [facility].  This is a screening for prostate cancer.  I do not usually interpret these results however I can forward them to your PCP.  You may go at your convenience to have this drawn.  We will have you follow-up in the clinic in 6 to 8 weeks after your procedure.  Any further recommendations will be relayed after your procedure.  It was a pleasure to see you today. I want to create trusting relationships with patients. If you receive a survey regarding your visit,  I greatly appreciate you taking time to fill this out on paper or through your MyChart. I value your feedback.  Venetia Night, MSN, FNP-BC, AGACNP-BC [facility]

## 2022-03-29 LAB — PSA: PSA: 1.42 ng/mL (ref <=4.00)

## 2022-04-10 NOTE — Patient Instructions (Signed)
Nathaniel Dunlap  04/10/2022     '@PREFPERIOPPHARMACY'$ @   Your procedure is scheduled on  04/14/2022.   Report to [name] at  0730 A.M.   Call this number if you have problems the morning of surgery:  [phone]   Remember:  Follow the diet and prep instructions given to you by the office.    Take these medicines the morning of surgery with A SIP OF WATER                                         None     Do not wear jewelry, make-up or nail polish.  Do not wear lotions, powders, or perfumes, or deodorant.  Do not shave 48 hours prior to surgery.  Men may shave face and neck.  Do not bring valuables to the hospital.  [facility] is not responsible for any belongings or valuables.  Contacts, dentures or bridgework may not be worn into surgery.  Leave your suitcase in the car.  After surgery it may be brought to your room.  For patients admitted to the hospital, discharge time will be determined by your treatment team.  Patients discharged the day of surgery will not be allowed to drive home and must have someone with them for 24 hours.    Special instructions:     DO NOT smoke tobacco or vape for 24 hours before your procedure.   Please read over the following fact sheets that you were given. Anesthesia Post-op Instructions and Care and Recovery After Surgery      Colonoscopy, Adult, Care After The following information offers guidance on how to care for yourself after your procedure. Your health care provider may also give you more specific instructions. If you have problems or questions, contact your health care provider. What can I expect after the procedure? After the procedure, it is common to have: A small amount of blood in your stool for 24 hours after the procedure. Some gas. Mild cramping or bloating of your abdomen. Follow these instructions at home: Eating and drinking  Drink enough fluid to keep your urine pale yellow. Follow instructions from  your health care provider about eating or drinking restrictions. Resume your normal diet as told by your health care provider. Avoid heavy or fried foods that are hard to digest. Activity Rest as told by your health care provider. Avoid sitting for a long time without moving. Get up to take short walks every 1-2 hours. This is important to improve blood flow and breathing. Ask for help if you feel weak or unsteady. Return to your normal activities as told by your health care provider. Ask your health care provider what activities are safe for you. Managing cramping and bloating  Try walking around when you have cramps or feel bloated. If directed, apply heat to your abdomen as told by your health care provider. Use the heat source that your health care provider recommends, such as a moist heat pack or a heating pad. Place a towel between your skin and the heat source. Leave the heat on for 20-30 minutes. Remove the heat if your skin turns bright red. This is especially important if you are unable to feel pain, heat, or cold. You have a greater risk of getting burned. General instructions If you were given a sedative during the procedure, it can  affect you for several hours. Do not drive or operate machinery until your health care provider says that it is safe. For the first 24 hours after the procedure: Do not sign important documents. Do not drink alcohol. Do your regular daily activities at a slower pace than normal. Eat soft foods that are easy to digest. Take over-the-counter and prescription medicines only as told by your health care provider. Keep all follow-up visits. This is important. Contact a health care provider if: You have blood in your stool 2-3 days after the procedure. Get help right away if: You have more than a small spotting of blood in your stool. You have large blood clots in your stool. You have swelling of your abdomen. You have nausea or vomiting. You have a  fever. You have increasing pain in your abdomen that is not relieved with medicine. These symptoms may be an emergency. Get help right away. Call 911. Do not wait to see if the symptoms will go away. Do not drive yourself to the hospital. Summary After the procedure, it is common to have a small amount of blood in your stool. You may also have mild cramping and bloating of your abdomen. If you were given a sedative during the procedure, it can affect you for several hours. Do not drive or operate machinery until your health care provider says that it is safe. Get help right away if you have a lot of blood in your stool, nausea or vomiting, a fever, or increased pain in your abdomen. This information is not intended to replace advice given to you by your health care provider. Make sure you discuss any questions you have with your health care provider. Document Revised: 04/13/2021 Document Reviewed: 04/13/2021 Elsevier Patient Education  [place] After This sheet gives you information about how to care for yourself after your procedure. Your health care provider may also give you more specific instructions. If you have problems or questions, contact your health care provider. What can I expect after the procedure? After the procedure, it is common to have: Tiredness. Forgetfulness about what happened after the procedure. Impaired judgment for important decisions. Nausea or vomiting. Some difficulty with balance. Follow these instructions at home: For the time period you were told by your health care provider:     Rest as needed. Do not participate in activities where you could fall or become injured. Do not drive or use machinery. Do not drink alcohol. Do not take sleeping pills or medicines that cause drowsiness. Do not make important decisions or sign legal documents. Do not take care of children on your own. Eating and drinking Follow the  diet that is recommended by your health care provider. Drink enough fluid to keep your urine pale yellow. If you vomit: Drink water, juice, or soup when you can drink without vomiting. Make sure you have little or no nausea before eating solid foods. General instructions Have a responsible adult stay with you for the time you are told. It is important to have someone help care for you until you are awake and alert. Take over-the-counter and prescription medicines only as told by your health care provider. If you have sleep apnea, surgery and certain medicines can increase your risk for breathing problems. Follow instructions from your health care provider about wearing your sleep device: Anytime you are sleeping, including during daytime naps. While taking prescription pain medicines, sleeping medicines, or medicines that make you drowsy. Avoid smoking.  Keep all follow-up visits as told by your health care provider. This is important. Contact a health care provider if: You keep feeling nauseous or you keep vomiting. You feel light-headed. You are still sleepy or having trouble with balance after 24 hours. You develop a rash. You have a fever. You have redness or swelling around the IV site. Get help right away if: You have trouble breathing. You have new-onset confusion at home. Summary For several hours after your procedure, you may feel tired. You may also be forgetful and have poor judgment. Have a responsible adult stay with you for the time you are told. It is important to have someone help care for you until you are awake and alert. Rest as told. Do not drive or operate machinery. Do not drink alcohol or take sleeping pills. Get help right away if you have trouble breathing, or if you suddenly become confused. This information is not intended to replace advice given to you by your health care provider. Make sure you discuss any questions you have with your health care  provider. Document Revised: 07/26/2021 Document Reviewed: 07/24/2019 Elsevier Patient Education  [place].

## 2022-04-12 ENCOUNTER — Encounter (HOSPITAL_COMMUNITY)
Admission: RE | Admit: 2022-04-12 | Discharge: 2022-04-12 | Disposition: A | Discharge status: Home / Self Care | Payer: BLUE CROSS/BLUE SHIELD | Source: Ambulatory Visit | Attending: Internal Medicine | Admitting: Internal Medicine

## 2022-04-14 ENCOUNTER — Ambulatory Visit (HOSPITAL_COMMUNITY): Payer: BLUE CROSS/BLUE SHIELD | Admitting: Anesthesiology

## 2022-04-14 ENCOUNTER — Encounter (HOSPITAL_COMMUNITY)
Admission: RE | Disposition: A | Discharge status: Home / Self Care | Payer: Self-pay | Source: Home / Self Care | Attending: Internal Medicine

## 2022-04-14 ENCOUNTER — Ambulatory Visit (HOSPITAL_COMMUNITY)
Admission: RE | Admit: 2022-04-14 | Discharge: 2022-04-14 | Disposition: A | Discharge status: Home / Self Care | Payer: BLUE CROSS/BLUE SHIELD | Attending: Internal Medicine | Admitting: Internal Medicine

## 2022-04-14 ENCOUNTER — Other Ambulatory Visit: Payer: Self-pay

## 2022-04-14 ENCOUNTER — Encounter (HOSPITAL_COMMUNITY): Payer: Self-pay

## 2022-04-14 DIAGNOSIS — D123 Benign neoplasm of transverse colon: Secondary | ICD-10-CM | POA: Insufficient documentation

## 2022-04-14 DIAGNOSIS — Z8042 Family history of malignant neoplasm of prostate: Secondary | ICD-10-CM | POA: Diagnosis not present

## 2022-04-14 DIAGNOSIS — K625 Hemorrhage of anus and rectum: Secondary | ICD-10-CM | POA: Diagnosis not present

## 2022-04-14 DIAGNOSIS — K529 Noninfective gastroenteritis and colitis, unspecified: Secondary | ICD-10-CM | POA: Insufficient documentation

## 2022-04-14 DIAGNOSIS — Z79899 Other long term (current) drug therapy: Secondary | ICD-10-CM | POA: Diagnosis not present

## 2022-04-14 DIAGNOSIS — D124 Benign neoplasm of descending colon: Secondary | ICD-10-CM | POA: Diagnosis not present

## 2022-04-14 DIAGNOSIS — D120 Benign neoplasm of cecum: Secondary | ICD-10-CM | POA: Diagnosis not present

## 2022-04-14 DIAGNOSIS — K648 Other hemorrhoids: Secondary | ICD-10-CM | POA: Diagnosis not present

## 2022-04-14 DIAGNOSIS — E7800 Pure hypercholesterolemia, unspecified: Secondary | ICD-10-CM | POA: Insufficient documentation

## 2022-04-14 DIAGNOSIS — K760 Fatty (change of) liver, not elsewhere classified: Secondary | ICD-10-CM | POA: Insufficient documentation

## 2022-04-14 DIAGNOSIS — F17210 Nicotine dependence, cigarettes, uncomplicated: Secondary | ICD-10-CM | POA: Diagnosis not present

## 2022-04-14 DIAGNOSIS — K921 Melena: Secondary | ICD-10-CM

## 2022-04-14 DIAGNOSIS — D122 Benign neoplasm of ascending colon: Secondary | ICD-10-CM | POA: Diagnosis not present

## 2022-04-14 DIAGNOSIS — Z800 Family history of malignant neoplasm of digestive organs: Secondary | ICD-10-CM | POA: Diagnosis not present

## 2022-04-14 DIAGNOSIS — Z8719 Personal history of other diseases of the digestive system: Secondary | ICD-10-CM | POA: Diagnosis not present

## 2022-04-14 HISTORY — PX: COLONOSCOPY WITH PROPOFOL: SHX5780

## 2022-04-14 HISTORY — PX: BIOPSY: SHX5522

## 2022-04-14 HISTORY — PX: POLYPECTOMY: SHX5525

## 2022-04-14 SURGERY — COLONOSCOPY WITH PROPOFOL
Anesthesia: General

## 2022-04-14 MED ORDER — LIDOCAINE HCL (CARDIAC) PF 100 MG/5ML IV SOSY
PREFILLED_SYRINGE | INTRAVENOUS | Status: DC | PRN
  Administered 2022-04-14: 50 mg via INTRAVENOUS

## 2022-04-14 MED ORDER — LACTATED RINGERS IV SOLN: INTRAVENOUS | Status: DC

## 2022-04-14 MED ORDER — PROPOFOL 10 MG/ML IV BOLUS
INTRAVENOUS | Status: DC | PRN
  Administered 2022-04-14: 20 mg via INTRAVENOUS
  Administered 2022-04-14: 60 mg via INTRAVENOUS
  Administered 2022-04-14: 20 mg via INTRAVENOUS

## 2022-04-14 MED ORDER — PROPOFOL 500 MG/50ML IV EMUL
INTRAVENOUS | Status: DC | PRN
  Administered 2022-04-14: 200 ug/kg/min via INTRAVENOUS

## 2022-04-14 NOTE — Anesthesia Postprocedure Evaluation (Signed)
Anesthesia Post Note  Patient: Nathaniel Dunlap  Procedure(s) Performed: COLONOSCOPY WITH PROPOFOL POLYPECTOMY BIOPSY  Patient location during evaluation: Phase II Anesthesia Type: General Level of consciousness: awake and alert and oriented Pain management: pain level controlled Vital Signs Assessment: post-procedure vital signs reviewed and stable Respiratory status: spontaneous breathing, nonlabored ventilation and respiratory function stable Cardiovascular status: blood pressure returned to baseline and stable Postop Assessment: no apparent nausea or vomiting Anesthetic complications: no   No notable events documented.   Last Vitals:  Vitals:   04/14/22 0940 04/14/22 0942  BP: (!) 79/49 101/63  Pulse: 68   Resp:    Temp:    SpO2:      Last Pain:  Vitals:   04/14/22 0934  TempSrc: Oral  PainSc: Asleep                 Ranveer Wahlstrom C Vanisha Whiten

## 2022-04-14 NOTE — Anesthesia Preprocedure Evaluation (Signed)
Anesthesia Evaluation  Patient identified by MRN, date of birth, ID band Patient awake    Reviewed: Allergy & Precautions, NPO status , Patient's Chart, lab work & pertinent test results  Airway Mallampati: I  TM Distance: >3 FB Neck ROM: Full    Dental  (+) Dental Advisory Given, Teeth Intact   Pulmonary Current Smoker and Patient abstained from smoking.,    Pulmonary exam normal breath sounds clear to auscultation       Cardiovascular negative cardio ROS Normal cardiovascular exam Rhythm:Regular Rate:Normal     Neuro/Psych  Headaches, TIAnegative psych ROS   GI/Hepatic negative GI ROS, (+)     substance abuse (h/o alcohol abuse, quit drinking 2 moths ago)  alcohol use,   Endo/Other  negative endocrine ROS  Renal/GU negative Renal ROS  negative genitourinary   Musculoskeletal negative musculoskeletal ROS (+)   Abdominal   Peds negative pediatric ROS (+)  Hematology negative hematology ROS (+)   Anesthesia Other Findings   Reproductive/Obstetrics negative OB ROS                            Anesthesia Physical Anesthesia Plan  ASA: 2  Anesthesia Plan: General   Post-op Pain Management: Minimal or no pain anticipated   Induction: Intravenous  PONV Risk Score and Plan: Propofol infusion  Airway Management Planned: Nasal Cannula and Natural Airway  Additional Equipment:   Intra-op Plan:   Post-operative Plan:   Informed Consent: I have reviewed the patients History and Physical, chart, labs and discussed the procedure including the risks, benefits and alternatives for the proposed anesthesia with the patient or authorized representative who has indicated his/her understanding and acceptance.     Dental advisory given  Plan Discussed with: CRNA and Surgeon  Anesthesia Plan Comments:        Anesthesia Quick Evaluation

## 2022-04-14 NOTE — Op Note (Signed)
[facility] Patient Name: Nathaniel Dunlap Procedure Date: 04/14/2022 9:01 AM MRN: [id number] Date of Birth: 15-Nov-1965 Attending MD: Elon Alas. Abbey Chatters DO CSN: [id number] Age: 56 Admit Type: Outpatient Procedure:                Colonoscopy Indications:              Chronic diarrhea, Rectal bleeding Providers:                Elon Alas. Abbey Chatters, DO, Caprice Kluver, [place]                            Risa Grill, Technician, Bethel Born,                            [employer] Referring MD:              Medicines:                See the Anesthesia note for documentation of the                            administered medications Complications:            No immediate complications. Estimated Blood Loss:     Estimated blood loss was minimal. Procedure:                Pre-Anesthesia Assessment:                           - The anesthesia plan was to use monitored                            anesthesia care (MAC).                           After obtaining informed consent, the colonoscope                            was passed under direct vision. Throughout the                            procedure, the patient's blood pressure, pulse, and                            oxygen saturations were monitored continuously. The                            [id number]-[id number] ([id number]) was introduced through the                            anus and advanced to the the cecum, identified by                            appendiceal orifice and ileocecal valve. The                            colonoscopy was performed without difficulty. The  patient tolerated the procedure well. The quality                            of the bowel preparation was evaluated using the                            BBPS [facility] Bowel Preparation Scale) with scores                            of: Right Colon = 3, Transverse Colon = 3 and Left                            Colon = 3 (entire mucosa seen well with no  residual                            staining, small fragments of stool or opaque                            liquid). The total BBPS score equals 9. Scope In: 9:12:28 AM Scope Out: 9:30:56 AM Scope Withdrawal Time: 0 hours 15 minutes 45 seconds  Total Procedure Duration: 0 hours 18 minutes 28 seconds  Findings:      The perianal and digital rectal examinations were normal.      Non-bleeding internal hemorrhoids were found during endoscopy.      Four sessile polyps were found in the descending colon, transverse       colon, ascending colon and cecum. The polyps were 3 to 7 mm in size.       These polyps were removed with a cold snare. Resection and retrieval       were complete.      Biopsies for histology were taken with a cold forceps from the ascending       colon, transverse colon and descending colon for evaluation of       microscopic colitis.      The exam was otherwise without abnormality. Impression:               - Non-bleeding internal hemorrhoids.                           - Four 3 to 7 mm polyps in the descending colon, in                            the transverse colon, in the ascending colon and in                            the cecum, removed with a cold snare. Resected and                            retrieved.                           - The examination was otherwise normal.                           -  Biopsies were taken with a cold forceps from the                            ascending colon, transverse colon and descending                            colon for evaluation of microscopic colitis. Moderate Sedation:      Per Anesthesia Care Recommendation:           - Patient has a contact number available for                            emergencies. The signs and symptoms of potential                            delayed complications were discussed with the                            patient. Return to normal activities tomorrow.                            Written  discharge instructions were provided to the                            patient.                           - Resume previous diet.                           - Continue present medications.                           - Await pathology results.                           - Repeat colonoscopy in 5 years for surveillance.                           - Return to GI clinic in 4 months. Procedure Code(s):        --- Professional ---                           [phone], Colonoscopy, flexible; with removal of                            tumor(s), polyp(s), or other lesion(s) by snare                            technique                           45380, 59, Colonoscopy, flexible; with biopsy,                            single or multiple Diagnosis Code(s):        ---  Professional ---                           K63.5, Polyp of colon                           K64.8, Other hemorrhoids                           K52.9, Noninfective gastroenteritis and colitis,                            unspecified                           K62.5, Hemorrhage of anus and rectum CPT copyright 2019 American Medical Association. All rights reserved. The codes documented in this report are preliminary and upon coder review may  be revised to meet current compliance requirements. Elon Alas. Abbey Chatters, DO [place] Abbey Chatters, DO 04/14/2022 9:33:36 AM This report has been signed electronically. Number of Addenda: 0

## 2022-04-14 NOTE — Transfer of Care (Signed)
Immediate Anesthesia Transfer of Care Note  Patient: Nathaniel Dunlap  Procedure(s) Performed: COLONOSCOPY WITH PROPOFOL POLYPECTOMY BIOPSY  Patient Location: PACU  Anesthesia Type:MAC  Level of Consciousness: drowsy  Airway & Oxygen Therapy: Patient Spontanous Breathing  Post-op Assessment: Report given to RN and Post -op Vital signs reviewed and stable  Post vital signs: Reviewed and stable  Last Vitals:  Vitals Value Taken Time  BP 84/62 04/14/22 0934  Temp 36.6 C 04/14/22 0934  Pulse 67 04/14/22 0934  Resp 20 04/14/22 0934  SpO2 99 % 04/14/22 0934    Last Pain:  Vitals:   04/14/22 0934  TempSrc: Oral  PainSc: Asleep      Patients Stated Pain Goal: 9 (62/83/66 [id number])  Complications: No notable events documented.

## 2022-04-14 NOTE — Discharge Instructions (Addendum)
  Colonoscopy Discharge Instructions  Read the instructions outlined below and refer to this sheet in the next few weeks. These discharge instructions provide you with general information on caring for yourself after you leave the hospital. Your doctor may also give you specific instructions. While your treatment has been planned according to the most current medical practices available, unavoidable complications occasionally occur.   ACTIVITY You may resume your regular activity, but move at a slower pace for the next 24 hours.  Take frequent rest periods for the next 24 hours.  Walking will help get rid of the air and reduce the bloated feeling in your belly (abdomen).  No driving for 24 hours (because of the medicine (anesthesia) used during the test).   Do not sign any important legal documents or operate any machinery for 24 hours (because of the anesthesia used during the test).  NUTRITION Drink plenty of fluids.  You may resume your normal diet as instructed by your doctor.  Begin with a light meal and progress to your normal diet. Heavy or fried foods are harder to digest and may make you feel sick to your stomach (nauseated).  Avoid alcoholic beverages for 24 hours or as instructed.  MEDICATIONS You may resume your normal medications unless your doctor tells you otherwise.  WHAT YOU CAN EXPECT TODAY Some feelings of bloating in the abdomen.  Passage of more gas than usual.  Spotting of blood in your stool or on the toilet paper.  IF YOU HAD POLYPS REMOVED DURING THE COLONOSCOPY: No aspirin products for 7 days or as instructed.  No alcohol for 7 days or as instructed.  Eat a soft diet for the next 24 hours.  FINDING OUT THE RESULTS OF YOUR TEST Not all test results are available during your visit. If your test results are not back during the visit, make an appointment with your caregiver to find out the results. Do not assume everything is normal if you have not heard from your  caregiver or the medical facility. It is important for you to follow up on all of your test results.  SEEK IMMEDIATE MEDICAL ATTENTION IF: You have more than a spotting of blood in your stool.  Your belly is swollen (abdominal distention).  You are nauseated or vomiting.  You have a temperature over 101.  You have abdominal pain or discomfort that is severe or gets worse throughout the day.   Your colonoscopy revealed 4 polyp(s) which I removed successfully. Await pathology results, my office will contact you. I recommend repeating colonoscopy in 5 years for surveillance purposes.  I also took biopsies your colon to rule out a condition called microscopic colitis which can cause chronically loose stools.  Follow-up with GI in 3 to 4 months.   I hope you have a great rest of your week!  Elon Alas. Abbey Chatters, D.O. Gastroenterology and Hepatology [facility]

## 2022-04-14 NOTE — Interval H&P Note (Signed)
History and Physical Interval Note:  04/14/2022 9:00 AM  Nathaniel Dunlap  has presented today for surgery, with the diagnosis of fhx colon cancer, hx rectal bleeding, intermittent diarrhea.  The various methods of treatment have been discussed with the patient and family. After consideration of risks, benefits and other options for treatment, the patient has consented to  Procedure(s) with comments: COLONOSCOPY WITH PROPOFOL (N/A) - 9:30am, asa 3 as a surgical intervention.  The patient's history has been reviewed, patient examined, no change in status, stable for surgery.  I have reviewed the patient's chart and labs.  Questions were answered to the patient's satisfaction.     Eloise Harman

## 2022-04-17 LAB — SURGICAL PATHOLOGY

## 2022-04-21 ENCOUNTER — Encounter (HOSPITAL_COMMUNITY): Payer: Self-pay | Admitting: Internal Medicine

## 2022-04-28 ENCOUNTER — Telehealth: Payer: Self-pay

## 2022-04-28 ENCOUNTER — Other Ambulatory Visit: Payer: Self-pay | Admitting: Family Medicine

## 2022-04-28 DIAGNOSIS — Z720 Tobacco use: Secondary | ICD-10-CM

## 2022-04-28 NOTE — Telephone Encounter (Signed)
Spoke to pt, states Nathaniel Dunlap asked for him to call once he ran out of chantix so she could prescribe another dose, please advice since Nathaniel Dunlap is out of the office?

## 2022-04-28 NOTE — Telephone Encounter (Signed)
Patient called needs to speak to nurse about the varenicline (CHANTIX) 0.5 MG tablet that was given to patient.

## 2022-05-03 ENCOUNTER — Other Ambulatory Visit: Payer: Self-pay | Admitting: Family Medicine

## 2022-05-03 DIAGNOSIS — Z720 Tobacco use: Secondary | ICD-10-CM

## 2022-05-04 ENCOUNTER — Telehealth: Payer: Self-pay | Admitting: Family Medicine

## 2022-05-04 NOTE — Telephone Encounter (Signed)
Please inform the patient to start taking '1mg'$  of Chantix which is 2 (two) tablets daily for the remainder of a 12-week course.

## 2022-05-04 NOTE — Telephone Encounter (Signed)
Please inform the patient to start taking '1mg'$  of Chantix which is 2 (two) tablets daily for the remainder of a 12-week course

## 2022-05-05 NOTE — Telephone Encounter (Signed)
Spoke with pt let him know rx is at the pharmacy.

## 2022-05-09 MED ORDER — VARENICLINE TARTRATE 0.5 MG PO TABS: ORAL_TABLET | ORAL | 0 refills | Status: AC

## 2022-05-22 ENCOUNTER — Other Ambulatory Visit: Payer: Self-pay | Admitting: Family Medicine

## 2022-05-22 NOTE — Telephone Encounter (Signed)
Unable to reach patient.

## 2022-05-27 ENCOUNTER — Other Ambulatory Visit: Payer: Self-pay | Admitting: Family Medicine

## 2022-05-27 DIAGNOSIS — E569 Vitamin deficiency, unspecified: Secondary | ICD-10-CM

## 2022-07-12 ENCOUNTER — Encounter: Payer: Self-pay | Admitting: Internal Medicine

## 2022-07-31 ENCOUNTER — Encounter: Payer: Self-pay | Admitting: Family Medicine

## 2022-07-31 ENCOUNTER — Ambulatory Visit (INDEPENDENT_AMBULATORY_CARE_PROVIDER_SITE_OTHER): Payer: Federal, State, Local not specified - PPO | Admitting: Family Medicine

## 2022-07-31 VITALS — BP 136/78 | HR 70 | Ht 71.0 in | Wt 182.1 lb

## 2022-07-31 DIAGNOSIS — Z720 Tobacco use: Secondary | ICD-10-CM

## 2022-07-31 DIAGNOSIS — E569 Vitamin deficiency, unspecified: Secondary | ICD-10-CM

## 2022-07-31 DIAGNOSIS — M722 Plantar fascial fibromatosis: Secondary | ICD-10-CM | POA: Diagnosis not present

## 2022-07-31 DIAGNOSIS — Z23 Encounter for immunization: Secondary | ICD-10-CM

## 2022-07-31 DIAGNOSIS — E559 Vitamin D deficiency, unspecified: Secondary | ICD-10-CM

## 2022-07-31 DIAGNOSIS — E782 Mixed hyperlipidemia: Secondary | ICD-10-CM

## 2022-07-31 DIAGNOSIS — Z0001 Encounter for general adult medical examination with abnormal findings: Secondary | ICD-10-CM | POA: Diagnosis not present

## 2022-07-31 DIAGNOSIS — E038 Other specified hypothyroidism: Secondary | ICD-10-CM

## 2022-07-31 DIAGNOSIS — R7301 Impaired fasting glucose: Secondary | ICD-10-CM

## 2022-07-31 DIAGNOSIS — E785 Hyperlipidemia, unspecified: Secondary | ICD-10-CM

## 2022-07-31 MED ORDER — VITAMIN D (ERGOCALCIFEROL) 1.25 MG (50000 UNIT) PO CAPS: 50000.0000 [IU] | ORAL_CAPSULE | ORAL | 3 refills | Status: DC

## 2022-07-31 MED ORDER — ATORVASTATIN CALCIUM 80 MG PO TABS: 80.0000 mg | ORAL_TABLET | Freq: Every day | ORAL | 3 refills | Status: DC
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
## 2022-07-31 NOTE — Assessment & Plan Note (Signed)
Planta fasciitis of the right heel Encourage conservative management with rest, ice, elevation, and cushioned footwear Patient verbalized understanding

## 2022-07-31 NOTE — Patient Instructions (Addendum)
I appreciate the opportunity to provide care to you today!    Follow up:  4 months  Labs: please stop by the lab during the week to get your blood drawn (CBC, CMP, TSH, Lipid profile, HgA1c, Vit D)   Plantar fasciitis is characterized by pain in the plantar aspect of the heel that is worse when initiating walking  Treatment for plantar fasciitis Rest, ice, pressure (compression), and raising (elevating) the affected foot. This is called RICE therapy. Your health care provider may recommend RICE therapy along with over-the-counter pain medicines to manage your pain. Exercises to stretch your calves and your plantar fascia. A splint that holds your foot in a stretched, upward position while you sleep (night splint). Managing Heel Pain put ice on the painful area. To do this: Put ice in a plastic bag, or use a frozen bottle of water. Place a towel between your skin and the bag or bottle. Roll the bottom of your foot over the bag or bottle. Do this for 20 minutes, 2-3 times a day. Wear athletic shoes that have air-sole or gel-sole cushions, or try soft shoe inserts that are designed for plantar fasciitis. Elevate your foot above the level of your heart while you are sitting or lying down.   Please continue to a heart-healthy diet and increase your physical activities. Try to exercise for 91mns at least three times a week.      It was a pleasure to see you and I look forward to continuing to work together on your health and well-being. Please do not hesitate to call the office if you need care or have questions about your care.   Have a wonderful day and week. With Gratitude, GAlvira MondayMSN, FNP-BC

## 2022-07-31 NOTE — Assessment & Plan Note (Addendum)
Resolved Patient reports smoking cessation with Chantix Congratulated patient on smoking cessation

## 2022-07-31 NOTE — Assessment & Plan Note (Addendum)
Physical exam as documented Counseling is done on healthy lifestyle involving commitment to 150 minutes of exercise per week,  Discussed heart-healthy diet  He received his varicella vaccination today

## 2022-08-03 DIAGNOSIS — E559 Vitamin D deficiency, unspecified: Secondary | ICD-10-CM | POA: Diagnosis not present

## 2022-08-03 DIAGNOSIS — E782 Mixed hyperlipidemia: Secondary | ICD-10-CM | POA: Diagnosis not present

## 2022-08-03 DIAGNOSIS — R7301 Impaired fasting glucose: Secondary | ICD-10-CM | POA: Diagnosis not present

## 2022-08-03 DIAGNOSIS — E038 Other specified hypothyroidism: Secondary | ICD-10-CM | POA: Diagnosis not present

## 2022-08-04 LAB — CMP14+EGFR
ALT: 30 IU/L (ref 0–44)
AST: 20 IU/L (ref 0–40)
Albumin/Globulin Ratio: 1.9 (ref 1.2–2.2)
Albumin: 4.6 g/dL (ref 3.8–4.9)
Alkaline Phosphatase: 80 IU/L (ref 44–121)
BUN/Creatinine Ratio: 15 (ref 9–20)
BUN: 16 mg/dL (ref 6–24)
Bilirubin Total: 0.4 mg/dL (ref 0.0–1.2)
CO2: 25 mmol/L (ref 20–29)
Calcium: 9.3 mg/dL (ref 8.7–10.2)
Chloride: 103 mmol/L (ref 96–106)
Creatinine, Ser: 1.06 mg/dL (ref 0.76–1.27)
Globulin, Total: 2.4 g/dL (ref 1.5–4.5)
Glucose: 151 mg/dL — ABNORMAL HIGH (ref 70–99)
Potassium: 4.7 mmol/L (ref 3.5–5.2)
Sodium: 141 mmol/L (ref 134–144)
Total Protein: 7 g/dL (ref 6.0–8.5)
eGFR: 82 mL/min/{1.73_m2} (ref >59)

## 2022-08-04 LAB — CBC WITH DIFFERENTIAL/PLATELET
Basophils Absolute: 0 10*3/uL (ref 0.0–0.2)
Basos: 1 %
EOS (ABSOLUTE): 0.3 10*3/uL (ref 0.0–0.4)
Eos: 10 %
Hematocrit: 40.8 % (ref 37.5–51.0)
Hemoglobin: 13.8 g/dL (ref 13.0–17.7)
Immature Grans (Abs): 0 10*3/uL (ref 0.0–0.1)
Immature Granulocytes: 0 %
Lymphocytes Absolute: 1.1 10*3/uL (ref 0.7–3.1)
Lymphs: 32 %
MCH: 30.1 pg (ref 26.6–33.0)
MCHC: 33.8 g/dL (ref 31.5–35.7)
MCV: 89 fL (ref 79–97)
Monocytes Absolute: 0.4 10*3/uL (ref 0.1–0.9)
Monocytes: 12 %
Neutrophils Absolute: 1.5 10*3/uL (ref 1.4–7.0)
Neutrophils: 45 %
Platelets: 186 10*3/uL (ref 150–450)
RBC: 4.59 x10E6/uL (ref 4.14–5.80)
RDW: 11.8 % (ref 11.6–15.4)
WBC: 3.4 10*3/uL (ref 3.4–10.8)

## 2022-08-04 LAB — LIPID PANEL
Chol/HDL Ratio: 4.5 ratio (ref 0.0–5.0)
Cholesterol, Total: 166 mg/dL (ref 100–199)
HDL: 37 mg/dL — ABNORMAL LOW (ref >39)
LDL Chol Calc (NIH): 106 mg/dL — ABNORMAL HIGH (ref 0–99)
Triglycerides: 129 mg/dL (ref 0–149)
VLDL Cholesterol Cal: 23 mg/dL (ref 5–40)

## 2022-08-04 LAB — HEMOGLOBIN A1C
Est. average glucose Bld gHb Est-mCnc: 148 mg/dL
Hgb A1c MFr Bld: 6.8 % — ABNORMAL HIGH (ref 4.8–5.6)

## 2022-08-04 LAB — TSH+FREE T4
Free T4: 1.2 ng/dL (ref 0.82–1.77)
TSH: 2.38 u[IU]/mL (ref 0.450–4.500)

## 2022-08-04 LAB — VITAMIN D 25 HYDROXY (VIT D DEFICIENCY, FRACTURES): Vit D, 25-Hydroxy: 26.5 ng/mL — ABNORMAL LOW (ref 30.0–100.0)

## 2022-08-07 ENCOUNTER — Other Ambulatory Visit: Payer: Self-pay | Admitting: Family Medicine

## 2022-08-07 DIAGNOSIS — E1165 Type 2 diabetes mellitus with hyperglycemia: Secondary | ICD-10-CM

## 2022-08-07 MED ORDER — METFORMIN HCL 500 MG PO TABS: 500.0000 mg | ORAL_TABLET | Freq: Two times a day (BID) | ORAL | 3 refills | Status: DC

## 2022-08-07 NOTE — Progress Notes (Signed)
Please inform the patient that his hemoglobin A1c is 6.8; he is considered diabetic.  A prescription for metformin 500 mg twice daily has been sent to his pharmacy.  His cholesterol levels have improved. I recommend he continue taking atorvastatin 80 mg daily.  His vitamin D is slightly low. I recommend that he continue taking weekly vitamin D supplements.  Please encourage the patient to increase his physical activities and decrease his intake of carbs and sugary foods

## 2022-08-08 ENCOUNTER — Telehealth: Payer: Self-pay | Admitting: Family Medicine

## 2022-08-08 NOTE — Telephone Encounter (Signed)
Prescription Request  08/08/2022  Is this a "Controlled Substance" medicine? No  LOV: 07/31/2022  What is the name of the medication or equipment? atorvastatin (LIPITOR) 80 MG tablet [[id number]]   metFORMIN (GLUCOPHAGE) 500 MG tablet [[id number]]   Have you contacted your pharmacy to request a refill? No   Which pharmacy would you like this sent to?  [facility]    Patient notified that their request is being sent to the clinical staff for review and that they should receive a response within 2 business days.   Please advise at Mobile [phone] (mobile)

## 2022-08-09 ENCOUNTER — Other Ambulatory Visit: Payer: Self-pay

## 2022-08-09 DIAGNOSIS — E785 Hyperlipidemia, unspecified: Secondary | ICD-10-CM

## 2022-08-09 DIAGNOSIS — E1165 Type 2 diabetes mellitus with hyperglycemia: Secondary | ICD-10-CM

## 2022-08-09 DIAGNOSIS — E569 Vitamin deficiency, unspecified: Secondary | ICD-10-CM

## 2022-08-09 MED ORDER — METFORMIN HCL 500 MG PO TABS: 500.0000 mg | ORAL_TABLET | Freq: Two times a day (BID) | ORAL | 3 refills | Status: DC

## 2022-08-09 MED ORDER — ATORVASTATIN CALCIUM 80 MG PO TABS: 80.0000 mg | ORAL_TABLET | Freq: Every day | ORAL | 3 refills | Status: DC

## 2022-08-09 MED ORDER — VITAMIN D (ERGOCALCIFEROL) 1.25 MG (50000 UNIT) PO CAPS: 50000.0000 [IU] | ORAL_CAPSULE | ORAL | 3 refills | Status: DC

## 2022-08-09 NOTE — Telephone Encounter (Signed)
Rx sent, pt advised 

## 2022-11-30 ENCOUNTER — Ambulatory Visit: Payer: Federal, State, Local not specified - PPO | Admitting: Family Medicine

## 2022-12-07 ENCOUNTER — Encounter: Payer: Self-pay | Admitting: Family Medicine

## 2022-12-07 ENCOUNTER — Ambulatory Visit: Payer: Federal, State, Local not specified - PPO | Admitting: Family Medicine

## 2022-12-07 VITALS — BP 116/78 | HR 76 | Ht 71.0 in | Wt 177.0 lb

## 2022-12-07 DIAGNOSIS — E1165 Type 2 diabetes mellitus with hyperglycemia: Secondary | ICD-10-CM

## 2022-12-07 DIAGNOSIS — Z720 Tobacco use: Secondary | ICD-10-CM | POA: Diagnosis not present

## 2022-12-07 DIAGNOSIS — E785 Hyperlipidemia, unspecified: Secondary | ICD-10-CM

## 2022-12-07 DIAGNOSIS — E119 Type 2 diabetes mellitus without complications: Secondary | ICD-10-CM | POA: Insufficient documentation

## 2022-12-07 DIAGNOSIS — L918 Other hypertrophic disorders of the skin: Secondary | ICD-10-CM

## 2022-12-07 DIAGNOSIS — E569 Vitamin deficiency, unspecified: Secondary | ICD-10-CM

## 2022-12-07 DIAGNOSIS — Z122 Encounter for screening for malignant neoplasm of respiratory organs: Secondary | ICD-10-CM

## 2022-12-07 DIAGNOSIS — R7301 Impaired fasting glucose: Secondary | ICD-10-CM

## 2022-12-07 DIAGNOSIS — E0789 Other specified disorders of thyroid: Secondary | ICD-10-CM

## 2022-12-07 DIAGNOSIS — E559 Vitamin D deficiency, unspecified: Secondary | ICD-10-CM

## 2022-12-07 DIAGNOSIS — E782 Mixed hyperlipidemia: Secondary | ICD-10-CM | POA: Diagnosis not present

## 2022-12-07 MED ORDER — VITAMIN D (ERGOCALCIFEROL) 1.25 MG (50000 UNIT) PO CAPS: 50000.0000 [IU] | ORAL_CAPSULE | ORAL | 3 refills | Status: DC

## 2022-12-07 MED ORDER — METFORMIN HCL 500 MG PO TABS: 500.0000 mg | ORAL_TABLET | Freq: Two times a day (BID) | ORAL | 3 refills | Status: DC

## 2022-12-07 MED ORDER — ATORVASTATIN CALCIUM 80 MG PO TABS: 80.0000 mg | ORAL_TABLET | Freq: Every day | ORAL | 3 refills | Status: DC

## 2022-12-07 NOTE — Assessment & Plan Note (Signed)
He takes atorvastatin 80 mg daily Denies muscle aches and pain Reports compliance with treatment regimen Lab Results  Component Value Date   CHOL 166 08/03/2022   HDL 37 (L) 08/03/2022   LDLCALC 106 (H) 08/03/2022   LDLDIRECT 67.3 04/17/2021   TRIG 129 08/03/2022   CHOLHDL 4.5 08/03/2022

## 2022-12-07 NOTE — Patient Instructions (Addendum)
I appreciate the opportunity to provide care to you today!    Follow up:  4 months  Labs: please stop by the lab today to get your blood drawn (CBC, CMP, TSH, Lipid profile, HgA1c, Vit D)    Referrals today- dermatology, pulmonary   Smoking is harmful to your health and increases your risk for cancer, COPD, high blood pressure, cataracts, digestive problems, or health problems , such as gum disease, mouth sores, and tooth loss and loss of taste and smell. Smoking irritates your throat and causes coughing.    Please continue to a heart-healthy diet and increase your physical activities. Try to exercise for 63mins at least five days a week.      It was a pleasure to see you and I look forward to continuing to work together on your health and well-being. Please do not hesitate to call the office if you need care or have questions about your care.   Have a wonderful day and week. With Gratitude, Alvira Monday MSN, FNP-BC

## 2022-12-07 NOTE — Assessment & Plan Note (Signed)
Multiple skin tags noted on the neck Patient would like a referral to dermatology for removal Referral placed
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
## 2022-12-07 NOTE — Assessment & Plan Note (Signed)
Smokes about 1/2 pack/day  Asked about quitting: confirms that he currently smokes cigarettes Advise to quit smoking: Educated about QUITTING to reduce the risk of cancer, cardio and cerebrovascular disease. Assess willingness: Unwilling to quit at this time, but is working on cutting back. Assist with counseling and pharmacotherapy: Counseled for 5 minutes and literature provided. Arrange for follow up: follow up in 3 months and continue to offer help.  

## 2022-12-07 NOTE — Assessment & Plan Note (Signed)
He reports taking metformin 500 mg daily rather than twice daily No reports of polyuria, polyphagia, polydipsia Will assess hemoglobin A1c today

## 2022-12-08 ENCOUNTER — Other Ambulatory Visit: Payer: Self-pay | Admitting: Family Medicine

## 2022-12-08 DIAGNOSIS — E7849 Other hyperlipidemia: Secondary | ICD-10-CM

## 2022-12-08 MED ORDER — EZETIMIBE 10 MG PO TABS
10.0000 mg | ORAL_TABLET | Freq: Every day | ORAL | 3 refills | Status: DC
Start: 2022-12-08 — End: 2023-04-09

## 2022-12-08 NOTE — Progress Notes (Signed)
Please continue taking your weekly vitamin D supplement because your vitamin D is low.   Your cholesterol levels are elevated. I want your LDL to be less than 100. I recommend avoiding simple carbohydrates including cakes, sweet desserts, ice cream, soda (diet or regular), sweet tea, candies, chips, cookies, store-bought juices, alcohol in excess of 1-2 drinks a day, lemonade, artificial sweeteners, donuts, coffee creamers, and sugar-free products.  I recommend avoiding greasy, fatty foods with increased physical activity. Please continue taking atorvastatin 80 mg daily . I have added ezetimibe 10 mg daily to your treatment regimen. The prescription is sent to your pharmacy.  Your hemoglobin A1c has decreased from 6.8 to 6.6.  I recommend taking metformin 500 mg twice daily.  All the labs are stable

## 2022-12-09 LAB — CBC WITH DIFFERENTIAL/PLATELET
Basophils Absolute: 0.1 10*3/uL (ref 0.0–0.2)
Basos: 1 %
EOS (ABSOLUTE): 0.5 10*3/uL — ABNORMAL HIGH (ref 0.0–0.4)
Eos: 9 %
Hematocrit: 42 % (ref 37.5–51.0)
Hemoglobin: 14.6 g/dL (ref 13.0–17.7)
Immature Grans (Abs): 0 10*3/uL (ref 0.0–0.1)
Immature Granulocytes: 1 %
Lymphocytes Absolute: 1.7 10*3/uL (ref 0.7–3.1)
Lymphs: 33 %
MCH: 31.5 pg (ref 26.6–33.0)
MCHC: 34.8 g/dL (ref 31.5–35.7)
MCV: 91 fL (ref 79–97)
Monocytes Absolute: 0.4 10*3/uL (ref 0.1–0.9)
Monocytes: 8 %
Neutrophils Absolute: 2.5 10*3/uL (ref 1.4–7.0)
Neutrophils: 48 %
Platelets: 251 10*3/uL (ref 150–450)
RBC: 4.63 x10E6/uL (ref 4.14–5.80)
RDW: 12.8 % (ref 11.6–15.4)
WBC: 5.2 10*3/uL (ref 3.4–10.8)

## 2022-12-09 LAB — LIPID PANEL
Chol/HDL Ratio: 7.6 ratio — ABNORMAL HIGH (ref 0.0–5.0)
Cholesterol, Total: 190 mg/dL (ref 100–199)
HDL: 25 mg/dL — ABNORMAL LOW (ref >39)
LDL Chol Calc (NIH): 112 mg/dL — ABNORMAL HIGH (ref 0–99)
Triglycerides: 301 mg/dL — ABNORMAL HIGH (ref 0–149)
VLDL Cholesterol Cal: 53 mg/dL — ABNORMAL HIGH (ref 5–40)

## 2022-12-09 LAB — CMP14+EGFR
ALT: 21 IU/L (ref 0–44)
AST: 14 IU/L (ref 0–40)
Albumin/Globulin Ratio: 1.7 (ref 1.2–2.2)
Albumin: 4.5 g/dL (ref 3.8–4.9)
Alkaline Phosphatase: 78 IU/L (ref 44–121)
BUN/Creatinine Ratio: 13 (ref 9–20)
BUN: 13 mg/dL (ref 6–24)
Bilirubin Total: 0.3 mg/dL (ref 0.0–1.2)
CO2: 22 mmol/L (ref 20–29)
Calcium: 9.3 mg/dL (ref 8.7–10.2)
Chloride: 105 mmol/L (ref 96–106)
Creatinine, Ser: 1.04 mg/dL (ref 0.76–1.27)
Globulin, Total: 2.6 g/dL (ref 1.5–4.5)
Glucose: 123 mg/dL — ABNORMAL HIGH (ref 70–99)
Potassium: 4.6 mmol/L (ref 3.5–5.2)
Sodium: 141 mmol/L (ref 134–144)
Total Protein: 7.1 g/dL (ref 6.0–8.5)
eGFR: 84 mL/min/{1.73_m2} (ref >59)

## 2022-12-09 LAB — MICROALBUMIN / CREATININE URINE RATIO
Creatinine, Urine: 208 mg/dL
Microalb/Creat Ratio: 6 mg/g creat (ref 0–29)
Microalbumin, Urine: 12.9 ug/mL

## 2022-12-09 LAB — TSH+FREE T4
Free T4: 1.23 ng/dL (ref 0.82–1.77)
TSH: 1.39 u[IU]/mL (ref 0.450–4.500)

## 2022-12-09 LAB — HEMOGLOBIN A1C
Est. average glucose Bld gHb Est-mCnc: 143 mg/dL
Hgb A1c MFr Bld: 6.6 % — ABNORMAL HIGH (ref 4.8–5.6)

## 2022-12-09 LAB — VITAMIN D 25 HYDROXY (VIT D DEFICIENCY, FRACTURES): Vit D, 25-Hydroxy: 22.4 ng/mL — ABNORMAL LOW (ref 30.0–100.0)

## 2022-12-21 ENCOUNTER — Other Ambulatory Visit: Payer: Self-pay | Admitting: *Deleted

## 2022-12-21 DIAGNOSIS — F17210 Nicotine dependence, cigarettes, uncomplicated: Secondary | ICD-10-CM

## 2022-12-21 DIAGNOSIS — Z122 Encounter for screening for malignant neoplasm of respiratory organs: Secondary | ICD-10-CM

## 2022-12-21 DIAGNOSIS — Z87891 Personal history of nicotine dependence: Secondary | ICD-10-CM

## 2022-12-26 ENCOUNTER — Ambulatory Visit: Payer: Federal, State, Local not specified - PPO | Admitting: Family Medicine

## 2023-01-16 ENCOUNTER — Ambulatory Visit (INDEPENDENT_AMBULATORY_CARE_PROVIDER_SITE_OTHER): Payer: Federal, State, Local not specified - PPO | Admitting: Acute Care

## 2023-01-16 ENCOUNTER — Encounter: Payer: Self-pay | Admitting: Acute Care

## 2023-01-16 DIAGNOSIS — F17210 Nicotine dependence, cigarettes, uncomplicated: Secondary | ICD-10-CM | POA: Diagnosis not present

## 2023-01-16 NOTE — Progress Notes (Signed)
Virtual Visit via Telephone Note  I connected with Nathaniel Dunlap on 01/16/23 at  2:00 PM EDT by telephone and verified that I am speaking with the correct person using two identifiers.  Location: Patient:  At home Provider:  [address], [place], [place], [address]    I discussed the limitations, risks, security and privacy concerns of performing an evaluation and management service by telephone and the availability of in person appointments. I also discussed with the patient that there may be a patient responsible charge related to this service. The patient expressed understanding and agreed to proceed.   Shared Decision Making Visit Lung Cancer Screening Program [phone])   Eligibility: Age 57 y.o. Pack Years Smoking History Calculation 43 pack year smoking history (# packs/per year x # years smoked) Recent History of coughing up blood  no Unexplained weight loss? no ( >Than 15 pounds within the last 6 months ) Prior History Lung / other cancer no (Diagnosis within the last 5 years already requiring surveillance chest CT Scans). Smoking Status Current Smoker Former Smokers: Years since quit: NA  Quit Date:  NA  Visit Components: Discussion included one or more decision making aids. yes Discussion included risk/benefits of screening. yes Discussion included potential follow up diagnostic testing for abnormal scans. yes Discussion included meaning and risk of over diagnosis. yes Discussion included meaning and risk of False Positives. yes Discussion included meaning of total radiation exposure. yes  Counseling Included: Importance of adherence to annual lung cancer LDCT screening. yes Impact of comorbidities on ability to participate in the program. yes Ability and willingness to under diagnostic treatment. yes  Smoking Cessation Counseling: Current Smokers:  Discussed importance of smoking cessation. yes Information about tobacco cessation classes and  interventions provided to patient. yes Patient provided with "ticket" for LDCT Scan.  NA Symptomatic Patient. No  Counseling NA Diagnosis Code: Tobacco Use Z72.0 Asymptomatic Patient yes  Counseling (Intermediate counseling: > three minutes counseling) U0454 Former Smokers:  Discussed the importance of maintaining cigarette abstinence. yes Diagnosis Code: Personal History of Nicotine Dependence. U98.119 Information about tobacco cessation classes and interventions provided to patient. Yes Patient provided with "ticket" for LDCT Scan. yes Written Order for Lung Cancer Screening with LDCT placed in Epic. Yes (CT Chest Lung Cancer Screening Low Dose W/O CM) [id number] Z12.2-Screening of respiratory organs Z87.891-Personal history of nicotine dependence  I have spent 25 minutes of face to face/ virtual visit   time with  Nathaniel Dunlap discussing the risks and benefits of lung cancer screening. We viewed / discussed a power point together that explained in detail the above noted topics. We paused at intervals to allow for questions to be asked and answered to ensure understanding.We discussed that the single most powerful action that he can take to decrease his risk of developing lung cancer is to quit smoking. We discussed whether or not he is ready to commit to setting a quit date. We discussed options for tools to aid in quitting smoking including nicotine replacement therapy, non-nicotine medications, support groups, Quit Smart classes, and behavior modification. We discussed that often times setting smaller, more achievable goals, such as eliminating 1 cigarette a day for a week and then 2 cigarettes a day for a week can be helpful in slowly decreasing the number of cigarettes smoked. This allows for a sense of accomplishment as well as providing a clinical benefit. I provided  him  with smoking cessation  information  with contact information for community resources, classes,  free nicotine replacement  therapy, and access to mobile apps, text messaging, and on-line smoking cessation help. I have also provided  him  the office contact information in the event he needs to contact me, or the screening staff. We discussed the time and location of the scan, and that either Abigail Miyamoto RN, Karlton Lemon, RN  or I will call / send a letter with the results within 24-72 hours of receiving them. The patient verbalized understanding of all of  the above and had no further questions upon leaving the office. They have my contact information in the event they have any further questions.  I spent 3 minutes counseling on smoking cessation and the health risks of continued tobacco abuse.  I explained to the patient that there has been a high incidence of coronary artery disease noted on these exams. I explained that this is a non-gated exam therefore degree or severity cannot be determined. This patient is on statin therapy. I have asked the patient to follow-up with their PCP regarding any incidental finding of coronary artery disease and management with diet or medication as their PCP  feels is clinically indicated. The patient verbalized understanding of the above and had no further questions upon completion of the visit.      Bevelyn Ngo, NP 01/16/2023

## 2023-01-16 NOTE — Patient Instructions (Signed)
Thank you for participating in the [facility]. It was our pleasure to meet you today. We will call you with the results of your scan within the next few days. Your scan will be assigned a Lung RADS category score by the physicians reading the scans.  This Lung RADS score determines follow up scanning.  See below for description of categories, and follow up screening recommendations. We will be in touch to schedule your follow up screening annually or based on recommendations of our providers. We will fax a copy of your scan results to your Primary Care Physician, or the physician who referred you to the program, to ensure they have the results. Please call the office if you have any questions or concerns regarding your scanning experience or results.  Our office number is [phone]. Please speak with Denise Phelps, RN. , or  Denise Buckner RN, They are  our Lung Cancer Screening RN.'s If They are unavailable when you call, Please leave a message on the voice mail. We will return your call at our earliest convenience.This voice mail is monitored several times a day.  Remember, if your scan is normal, we will scan you annually as long as you continue to meet the criteria for the program. (Age 50-80, Current smoker or smoker who has quit within the last 15 years). If you are a smoker, remember, quitting is the single most powerful action that you can take to decrease your risk of lung cancer and other pulmonary, breathing related problems. We know quitting is hard, and we are here to help.  Please let us know if there is anything we can do to help you meet your goal of quitting. If you are a former smoker, congratulations. We are proud of you! Remain smoke free! Remember you can refer friends or family members through the number above.  We will screen them to make sure they meet criteria for the program. Thank you for helping us take better care of you by  participating in Lung Screening.  You can receive free nicotine replacement therapy ( patches, gum or mints) by calling 1-800-QUIT NOW. Please call so we can get you on the path to becoming  a non-smoker. I know it is hard, but you can do this!  Lung RADS Categories:  Lung RADS 1: no nodules or definitely non-concerning nodules.  Recommendation is for a repeat annual scan in 12 months.  Lung RADS 2:  nodules that are non-concerning in appearance and behavior with a very low likelihood of becoming an active cancer. Recommendation is for a repeat annual scan in 12 months.  Lung RADS 3: nodules that are probably non-concerning , includes nodules with a low likelihood of becoming an active cancer.  Recommendation is for a 6-month repeat screening scan. Often noted after an upper respiratory illness. We will be in touch to make sure you have no questions, and to schedule your 6-month scan.  Lung RADS 4 A: nodules with concerning findings, recommendation is most often for a follow up scan in 3 months or additional testing based on our provider's assessment of the scan. We will be in touch to make sure you have no questions and to schedule the recommended 3 month follow up scan.  Lung RADS 4 B:  indicates findings that are concerning. We will be in touch with you to schedule additional diagnostic testing based on our provider's  assessment of the scan.  Other options for assistance in smoking cessation (   As covered by your insurance benefits)  Hypnosis for smoking cessation  [employer] [phone]  Acupuncture for smoking cessation  [facility] [phone]   

## 2023-01-17 ENCOUNTER — Ambulatory Visit (HOSPITAL_COMMUNITY)
Admission: RE | Admit: 2023-01-17 | Discharge: 2023-01-17 | Disposition: A | Discharge status: Home / Self Care | Payer: Federal, State, Local not specified - PPO | Source: Ambulatory Visit | Attending: Acute Care | Admitting: Acute Care

## 2023-01-17 DIAGNOSIS — Z122 Encounter for screening for malignant neoplasm of respiratory organs: Secondary | ICD-10-CM | POA: Insufficient documentation

## 2023-01-17 DIAGNOSIS — F17210 Nicotine dependence, cigarettes, uncomplicated: Secondary | ICD-10-CM | POA: Insufficient documentation

## 2023-01-17 DIAGNOSIS — Z87891 Personal history of nicotine dependence: Secondary | ICD-10-CM | POA: Insufficient documentation

## 2023-01-22 ENCOUNTER — Other Ambulatory Visit: Payer: Self-pay

## 2023-01-22 DIAGNOSIS — Z87891 Personal history of nicotine dependence: Secondary | ICD-10-CM

## 2023-01-22 DIAGNOSIS — F17210 Nicotine dependence, cigarettes, uncomplicated: Secondary | ICD-10-CM

## 2023-01-22 DIAGNOSIS — Z122 Encounter for screening for malignant neoplasm of respiratory organs: Secondary | ICD-10-CM

## 2023-04-09 ENCOUNTER — Ambulatory Visit: Payer: Federal, State, Local not specified - PPO | Admitting: Family Medicine

## 2023-04-09 ENCOUNTER — Encounter: Payer: Self-pay | Admitting: Family Medicine

## 2023-04-09 VITALS — BP 130/80 | HR 73 | Ht 71.0 in | Wt 178.0 lb

## 2023-04-09 DIAGNOSIS — E1100 Type 2 diabetes mellitus with hyperosmolarity without nonketotic hyperglycemic-hyperosmolar coma (NKHHC): Secondary | ICD-10-CM | POA: Diagnosis not present

## 2023-04-09 DIAGNOSIS — E569 Vitamin deficiency, unspecified: Secondary | ICD-10-CM

## 2023-04-09 DIAGNOSIS — R7301 Impaired fasting glucose: Secondary | ICD-10-CM

## 2023-04-09 DIAGNOSIS — E782 Mixed hyperlipidemia: Secondary | ICD-10-CM

## 2023-04-09 DIAGNOSIS — M62838 Other muscle spasm: Secondary | ICD-10-CM | POA: Diagnosis not present

## 2023-04-09 DIAGNOSIS — E038 Other specified hypothyroidism: Secondary | ICD-10-CM | POA: Diagnosis not present

## 2023-04-09 DIAGNOSIS — E7849 Other hyperlipidemia: Secondary | ICD-10-CM | POA: Diagnosis not present

## 2023-04-09 DIAGNOSIS — E559 Vitamin D deficiency, unspecified: Secondary | ICD-10-CM

## 2023-04-09 DIAGNOSIS — E785 Hyperlipidemia, unspecified: Secondary | ICD-10-CM

## 2023-04-09 MED ORDER — EZETIMIBE 10 MG PO TABS
10.0000 mg | ORAL_TABLET | Freq: Every day | ORAL | 1 refills | Status: DC
Start: 2023-04-09 — End: 2023-08-09

## 2023-04-09 MED ORDER — VITAMIN D (ERGOCALCIFEROL) 1.25 MG (50000 UNIT) PO CAPS
50000.0000 [IU] | ORAL_CAPSULE | ORAL | 1 refills | Status: DC
Start: 2023-04-09 — End: 2023-08-09

## 2023-04-09 MED ORDER — CYCLOBENZAPRINE HCL 5 MG PO TABS
5.0000 mg | ORAL_TABLET | Freq: Every day | ORAL | 1 refills | Status: DC
Start: 2023-04-09 — End: 2023-12-06

## 2023-04-09 MED ORDER — ATORVASTATIN CALCIUM 80 MG PO TABS
80.0000 mg | ORAL_TABLET | Freq: Every day | ORAL | 1 refills | Status: DC
Start: 2023-04-09 — End: 2023-08-09

## 2023-04-09 NOTE — Assessment & Plan Note (Addendum)
The pt works requires frequent bending and lifting C/o a pulling sensation in the lower back at the thoracic spine No recent injury or trauma No weakness, numbness or tingling Will treat with flexeril 5 mg at bedtime Encouraged to take OTC tylenol as needed for pain relief  Will consider a referral to PT

## 2023-04-09 NOTE — Assessment & Plan Note (Addendum)
He takes atorvastatin 80 mg daily and ezetimibe 10 mg daily Denies muscle aches and pain Recommended Increasing his intake of foods rich in fruits, vegetables, whole grains Lean proteins: chicken, fish, beans, legumes Low Fat dairy products Reduced intake of saturated fats, trans fatty acids, cholesterol Aim to be active at least 5 days a week for 30 minutes each day ( walking briskly)  Reports compliance with treatment regimen Pending lipid panel Lab Results  Component Value Date   CHOL 190 12/07/2022   HDL 25 (L) 12/07/2022   LDLCALC 112 (H) 12/07/2022   LDLDIRECT 67.3 04/17/2021   TRIG 301 (H) 12/07/2022   CHOLHDL 7.6 (H) 12/07/2022

## 2023-04-09 NOTE — Patient Instructions (Addendum)
I appreciate the opportunity to provide care to you today!    Follow up:  4 months  Labs: please stop by the lab today to get your blood drawn (CBC, CMP, TSH, Lipid profile, HgA1c, Vit D)  Schedule diabetic eye exam   Conservative managements for Low Back Pain include Heat therapy --  helps reduce muscle spasm Cold Therapy- helps reduce edema Take [id number] antiinflammatory drugs: ibuprofen (600 mg Q8H)    Attached with your AVS, you will find valuable resources for self-education. I highly recommend dedicating some time to thoroughly examine them.   Please continue to a heart-healthy diet and increase your physical activities. Try to exercise for at least five days a week.    It was a pleasure to see you and I look forward to continuing to work together on your health and well-being. Please do not hesitate to call the office if you need care or have questions about your care.  In case of emergency, please visit the Emergency Department for urgent care, or contact our clinic at [phone] to schedule an appointment. We're here to help you!   Have a wonderful day and week. With Gratitude, Gilmore Laroche MSN, FNP-BC

## 2023-04-09 NOTE — Assessment & Plan Note (Signed)
Reports that he has not been taking metformin 500 mg BID as prescribed due to side effects of of diarrhea Denies polyuria, polydipsia and polyphagia Encouraged decreasing his intake of high sugar foods and beverages with increased physical activities Pending hga1c Lab Results  Component Value Date   HGBA1C 6.6 (H) 12/07/2022
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
## 2023-04-10 ENCOUNTER — Other Ambulatory Visit: Payer: Self-pay | Admitting: Family Medicine

## 2023-04-10 DIAGNOSIS — E1100 Type 2 diabetes mellitus with hyperosmolarity without nonketotic hyperglycemic-hyperosmolar coma (NKHHC): Secondary | ICD-10-CM

## 2023-04-10 MED ORDER — GLIMEPIRIDE 2 MG PO TABS: 2.0000 mg | ORAL_TABLET | Freq: Every day | ORAL | 1 refills | Status: DC

## 2023-05-08 ENCOUNTER — Telehealth: Payer: Self-pay | Admitting: Family Medicine

## 2023-05-08 NOTE — Telephone Encounter (Signed)
FMLA  Copied Noted Sleeved (put copy in provider box)  Call patient [phone] when forms are completed.

## 2023-05-16 ENCOUNTER — Ambulatory Visit (INDEPENDENT_AMBULATORY_CARE_PROVIDER_SITE_OTHER): Payer: Federal, State, Local not specified - PPO

## 2023-05-16 DIAGNOSIS — E1100 Type 2 diabetes mellitus with hyperosmolarity without nonketotic hyperglycemic-hyperosmolar coma (NKHHC): Secondary | ICD-10-CM | POA: Diagnosis not present

## 2023-05-16 DIAGNOSIS — E119 Type 2 diabetes mellitus without complications: Secondary | ICD-10-CM

## 2023-05-16 LAB — HM DIABETES EYE EXAM

## 2023-05-16 NOTE — Progress Notes (Signed)
Nathaniel Dunlap arrived 05/16/2023 and has given verbal consent to obtain images and complete their overdue diabetic retinal screening.  The images have been sent to an ophthalmologist or optometrist for review and interpretation.  Results will be sent back to Gilmore Laroche, FNP for review.  Patient has been informed they will be contacted when we receive the results via telephone or MyChart

## 2023-06-07 ENCOUNTER — Encounter: Payer: Self-pay | Admitting: Dermatology

## 2023-06-07 ENCOUNTER — Ambulatory Visit: Payer: Federal, State, Local not specified - PPO | Admitting: Dermatology

## 2023-06-07 VITALS — BP 128/80 | HR 81

## 2023-06-07 DIAGNOSIS — M7712 Lateral epicondylitis, left elbow: Secondary | ICD-10-CM | POA: Insufficient documentation

## 2023-06-07 DIAGNOSIS — L918 Other hypertrophic disorders of the skin: Secondary | ICD-10-CM | POA: Diagnosis not present

## 2023-06-07 DIAGNOSIS — M7711 Lateral epicondylitis, right elbow: Secondary | ICD-10-CM | POA: Insufficient documentation

## 2023-06-07 DIAGNOSIS — M4302 Spondylolysis, cervical region: Secondary | ICD-10-CM | POA: Insufficient documentation

## 2023-06-07 DIAGNOSIS — S63591A Other specified sprain of right wrist, initial encounter: Secondary | ICD-10-CM | POA: Insufficient documentation

## 2023-06-07 DIAGNOSIS — B079 Viral wart, unspecified: Secondary | ICD-10-CM

## 2023-06-07 DIAGNOSIS — M4306 Spondylolysis, lumbar region: Secondary | ICD-10-CM | POA: Insufficient documentation

## 2023-06-07 DIAGNOSIS — M533 Sacrococcygeal disorders, not elsewhere classified: Secondary | ICD-10-CM | POA: Insufficient documentation

## 2023-06-07 DIAGNOSIS — M545 Low back pain, unspecified: Secondary | ICD-10-CM | POA: Insufficient documentation

## 2023-06-07 NOTE — Patient Instructions (Addendum)

## 2023-06-07 NOTE — Progress Notes (Signed)
   New Patient Visit   Subjective  Nathaniel Dunlap is a 57 y.o. male who presents for the following: Skin Tags  Patient states he  has skin tags located at the face that he  would like to have examined. Patient reports the areas have been there for several years. He reports the areas are not bothersome.Patient rates irritation 0 out of 10. He states that the areas have spread. Patient reports he  has not previously been treated for these areas. Patient denies Hx of bx. Patient denies family history of skin cancer(s). Patient states he was recently diagnosed with Type 2 Diabetes.   The patient has spots, moles and lesions to be evaluated, some may be new or changing and the patient may have concern these could be cancer.  The following portions of the chart were reviewed this encounter and updated as appropriate: medications, allergies, medical history  Review of Systems:  No other skin or systemic complaints except as noted in HPI or Assessment and Plan.  Objective  Well appearing patient in no apparent distress; mood and affect are within normal limits.   A focused examination was performed of the following areas: Face and Neck  Relevant exam findings are noted in the Assessment and Plan.  Left Zygomatic Area, Right Malar Cheek, Right Temple Verrucous papules   Anterior Mid Neck (4), Left Anterior Neck (3), Left Posterior Neck (3), Right Anterior Neck (9), Right Posterior Neck (2), Right Supraclavicular Area (3)            Assessment & Plan   Acrochordons (Irritated Skin Tags) - Fleshy, skin-colored pedunculated papules - Benign appearing.  - Observe. - If desired, they can be removed with an in office procedure that is not covered by insurance. - Please call the clinic if you notice any new or changing lesions.   Filiform WART Exam: verrucous papule(s)  Counseling Discussed viral / HPV (Human Papilloma Virus) etiology and risk of spread /infectivity to other areas of  body as well as to other people.  Multiple treatments and methods may be required to clear warts and it is possible treatment may not be successful.  Treatment risks include discoloration; scarring and there is still potential for wart recurrence.  Treatment Plan:  Filiform warts (3) Right Temple; Left Zygomatic Area; Right Malar Cheek  Destruction of lesion - Left Zygomatic Area, Right Malar Cheek, Right Temple  Destruction method: cryotherapy   Informed consent: discussed and consent obtained   Timeout:  patient name, date of birth, surgical site, and procedure verified Outcome: patient tolerated procedure well with no complications    Inflamed acrochordon (24) Left Anterior Neck (3); Right Anterior Neck (9); Left Posterior Neck (3); Right Posterior Neck (2); Anterior Mid Neck (4); Right Supraclavicular Area (3)  Destruction of lesion - Anterior Mid Neck (4), Left Anterior Neck (3), Left Posterior Neck (3), Right Anterior Neck (9), Right Posterior Neck (2), Right Supraclavicular Area (3)  Destruction method: cryotherapy   Informed consent: discussed and consent obtained   Timeout:  patient name, date of birth, surgical site, and procedure verified Lesion destroyed using liquid nitrogen: Yes   Cryotherapy cycles:  25 Outcome: patient tolerated procedure well with no complications      No follow-ups on file.    Documentation: I have reviewed the above documentation for accuracy and completeness, and I agree with the above.  Stasia Cavalier, am acting as scribe for Langston Reusing, DO.  Langston Reusing, DO

## 2023-08-09 ENCOUNTER — Ambulatory Visit: Payer: Federal, State, Local not specified - PPO | Admitting: Family Medicine

## 2023-08-09 ENCOUNTER — Encounter: Payer: Self-pay | Admitting: Family Medicine

## 2023-08-09 VITALS — BP 130/83 | HR 80 | Ht 71.0 in | Wt 180.0 lb

## 2023-08-09 DIAGNOSIS — E1100 Type 2 diabetes mellitus with hyperosmolarity without nonketotic hyperglycemic-hyperosmolar coma (NKHHC): Secondary | ICD-10-CM

## 2023-08-09 DIAGNOSIS — E038 Other specified hypothyroidism: Secondary | ICD-10-CM

## 2023-08-09 DIAGNOSIS — E559 Vitamin D deficiency, unspecified: Secondary | ICD-10-CM | POA: Diagnosis not present

## 2023-08-09 DIAGNOSIS — E782 Mixed hyperlipidemia: Secondary | ICD-10-CM

## 2023-08-09 DIAGNOSIS — Z7984 Long term (current) use of oral hypoglycemic drugs: Secondary | ICD-10-CM

## 2023-08-09 DIAGNOSIS — E569 Vitamin deficiency, unspecified: Secondary | ICD-10-CM

## 2023-08-09 DIAGNOSIS — E785 Hyperlipidemia, unspecified: Secondary | ICD-10-CM

## 2023-08-09 MED ORDER — EZETIMIBE 10 MG PO TABS
10.0000 mg | ORAL_TABLET | Freq: Every day | ORAL | 1 refills | Status: DC
Start: 2023-08-09 — End: 2024-04-10

## 2023-08-09 MED ORDER — ATORVASTATIN CALCIUM 80 MG PO TABS
80.0000 mg | ORAL_TABLET | Freq: Every day | ORAL | 1 refills | Status: DC
Start: 2023-08-09 — End: 2024-04-10

## 2023-08-09 MED ORDER — GLIMEPIRIDE 2 MG PO TABS: 2.0000 mg | ORAL_TABLET | Freq: Every day | ORAL | 1 refills | Status: DC

## 2023-08-09 MED ORDER — VITAMIN D (ERGOCALCIFEROL) 1.25 MG (50000 UNIT) PO CAPS: 50000.0000 [IU] | ORAL_CAPSULE | ORAL | 1 refills | Status: DC

## 2023-08-09 NOTE — Assessment & Plan Note (Signed)
The patient reports that he never picked up the prescribed glimepiride 2 mg to start therapy and has been without medication for his type 2 diabetes for the past three months. He denies experiencing polyuria, polyphagia, or polydipsia. I encouraged him to decrease his intake of high-sugar foods and beverages while increasing his physical activity. To resume his therapy, we will send a refill of glimepiride 2 mg daily to his pharmacy. We are also awaiting his hemoglobin A1c results to assess his current diabetes control.  Lab Results  Component Value Date   HGBA1C 6.4 (H) 04/09/2023

## 2023-08-09 NOTE — Assessment & Plan Note (Signed)
The patient takes atorvastatin 80 mg daily but reports not taking ezetimibe 10 mg daily, noting that he forgot to refill the prescription. He denies any muscle aches or pain. I recommended increasing his intake of foods rich in fruits, vegetables, and whole grains, as well as incorporating lean proteins such as chicken, fish, beans, and legumes. He should also opt for low-fat dairy products and reduce his intake of saturated fats, trans fatty acids, and cholesterol. Additionally, I encouraged him to aim for at least 30 minutes of brisk walking or other physical activities on at least five days a week.  Lab Results  Component Value Date   CHOL 166 04/09/2023   HDL 26 (L) 04/09/2023   LDLCALC 97 04/09/2023   LDLDIRECT 67.3 04/17/2021   TRIG 252 (H) 04/09/2023   CHOLHDL 6.4 (H) 04/09/2023
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
## 2023-08-09 NOTE — Assessment & Plan Note (Signed)
He reports compliance with his weekly vitamin D supplement

## 2023-08-09 NOTE — Patient Instructions (Addendum)

## 2023-08-16 ENCOUNTER — Other Ambulatory Visit: Payer: Self-pay | Admitting: Family Medicine

## 2023-08-16 DIAGNOSIS — E1100 Type 2 diabetes mellitus with hyperosmolarity without nonketotic hyperglycemic-hyperosmolar coma (NKHHC): Secondary | ICD-10-CM | POA: Diagnosis not present

## 2023-08-16 DIAGNOSIS — E782 Mixed hyperlipidemia: Secondary | ICD-10-CM | POA: Diagnosis not present

## 2023-08-16 DIAGNOSIS — E038 Other specified hypothyroidism: Secondary | ICD-10-CM | POA: Diagnosis not present

## 2023-08-16 DIAGNOSIS — E559 Vitamin D deficiency, unspecified: Secondary | ICD-10-CM | POA: Diagnosis not present

## 2023-08-17 LAB — LIPID PANEL
Chol/HDL Ratio: 6.7 {ratio} — ABNORMAL HIGH (ref 0.0–5.0)
Cholesterol, Total: 202 mg/dL — ABNORMAL HIGH (ref 100–199)
HDL: 30 mg/dL — ABNORMAL LOW (ref >39)
LDL Chol Calc (NIH): 118 mg/dL — ABNORMAL HIGH (ref 0–99)
Triglycerides: 307 mg/dL — ABNORMAL HIGH (ref 0–149)
VLDL Cholesterol Cal: 54 mg/dL — ABNORMAL HIGH (ref 5–40)

## 2023-08-17 LAB — CBC WITH DIFFERENTIAL/PLATELET
Basophils Absolute: 0 10*3/uL (ref 0.0–0.2)
Basos: 1 %
EOS (ABSOLUTE): 0.4 10*3/uL (ref 0.0–0.4)
Eos: 8 %
Hematocrit: 46.2 % (ref 37.5–51.0)
Hemoglobin: 15.6 g/dL (ref 13.0–17.7)
Immature Grans (Abs): 0 10*3/uL (ref 0.0–0.1)
Immature Granulocytes: 1 %
Lymphocytes Absolute: 1.8 10*3/uL (ref 0.7–3.1)
Lymphs: 32 %
MCH: 32 pg (ref 26.6–33.0)
MCHC: 33.8 g/dL (ref 31.5–35.7)
MCV: 95 fL (ref 79–97)
Monocytes Absolute: 0.5 10*3/uL (ref 0.1–0.9)
Monocytes: 10 %
Neutrophils Absolute: 2.7 10*3/uL (ref 1.4–7.0)
Neutrophils: 48 %
Platelets: 202 10*3/uL (ref 150–450)
RBC: 4.88 x10E6/uL (ref 4.14–5.80)
RDW: 12.2 % (ref 11.6–15.4)
WBC: 5.6 10*3/uL (ref 3.4–10.8)

## 2023-08-17 LAB — HEMOGLOBIN A1C
Est. average glucose Bld gHb Est-mCnc: 137 mg/dL
Hgb A1c MFr Bld: 6.4 % — ABNORMAL HIGH (ref 4.8–5.6)

## 2023-08-17 LAB — TSH+FREE T4
Free T4: 1.29 ng/dL (ref 0.82–1.77)
TSH: 1.24 u[IU]/mL (ref 0.450–4.500)

## 2023-08-17 LAB — CMP14+EGFR
ALT: 21 [IU]/L (ref 0–44)
AST: 16 [IU]/L (ref 0–40)
Albumin: 4.6 g/dL (ref 3.8–4.9)
Alkaline Phosphatase: 77 [IU]/L (ref 44–121)
BUN/Creatinine Ratio: 13 (ref 9–20)
BUN: 12 mg/dL (ref 6–24)
Bilirubin Total: 0.4 mg/dL (ref 0.0–1.2)
CO2: 27 mmol/L (ref 20–29)
Calcium: 9.5 mg/dL (ref 8.7–10.2)
Chloride: 101 mmol/L (ref 96–106)
Creatinine, Ser: 0.93 mg/dL (ref 0.76–1.27)
Globulin, Total: 2.5 g/dL (ref 1.5–4.5)
Glucose: 98 mg/dL (ref 70–99)
Potassium: 5.1 mmol/L (ref 3.5–5.2)
Sodium: 140 mmol/L (ref 134–144)
Total Protein: 7.1 g/dL (ref 6.0–8.5)
eGFR: 96 mL/min/{1.73_m2} (ref >59)

## 2023-08-17 LAB — VITAMIN D 25 HYDROXY (VIT D DEFICIENCY, FRACTURES): Vit D, 25-Hydroxy: 24.2 ng/mL — ABNORMAL LOW (ref 30.0–100.0)

## 2023-08-19 ENCOUNTER — Other Ambulatory Visit: Payer: Self-pay | Admitting: Family Medicine

## 2023-08-19 DIAGNOSIS — E569 Vitamin deficiency, unspecified: Secondary | ICD-10-CM

## 2023-08-19 NOTE — Progress Notes (Signed)
Please take atorvastatin 80 mg daily AND ezetimibe 10 mg daily to achieve better control of your elevated cholesterol. I recommend adopting lifestyle changes, including a heart-healthy diet, reducing your intake of greasy and fatty foods, and increasing physical activity. Your hemoglobin A1c is 6.4, which indicates good control of your blood sugar; I recommend continuing your current treatment regimen while reducing your intake of high-sugar foods and beverages and increasing physical activity. Additionally, please continue taking your weekly vitamin D supplement, as your vitamin D levels are low.

## 2023-08-19 NOTE — Progress Notes (Unsigned)
The 10-year ASCVD risk score (Arnett DK, et al., 2019) is: 31.6%   Values used to calculate the score:     Age: 57 years     Sex: Male     Is Non-Hispanic African American: No     Diabetic: Yes     Tobacco smoker: Yes     Systolic Blood Pressure: 130 mmHg     Is BP treated: No     HDL Cholesterol: 30 mg/dL     Total Cholesterol: 202 mg/dL

## 2023-12-06 ENCOUNTER — Ambulatory Visit
Admission: RE | Admit: 2023-12-06 | Discharge: 2023-12-06 | Disposition: A | Discharge status: Home / Self Care | Source: Ambulatory Visit | Attending: Nurse Practitioner | Admitting: Nurse Practitioner

## 2023-12-06 VITALS — BP 125/80 | HR 86 | Temp 98.4°F | Resp 18

## 2023-12-06 DIAGNOSIS — M109 Gout, unspecified: Secondary | ICD-10-CM

## 2023-12-06 MED ORDER — PREDNISONE 20 MG PO TABS: ORAL_TABLET | ORAL | 0 refills | Status: AC

## 2023-12-06 MED ORDER — METHYLPREDNISOLONE ACETATE 40 MG/ML IJ SUSP
40.0000 mg | Freq: Once | INTRAMUSCULAR | Status: AC
  Administered 2023-12-06: 40 mg via INTRAMUSCULAR
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
## 2023-12-06 NOTE — Discharge Instructions (Addendum)
 As we discussed, your symptoms are consistent with a gout flare.  We have checked the uric acid level today and will treat you for gout today with an injection of steroid medication.  Start the steroid pills tomorrow.  Follow up with PCP as planned next week.

## 2023-12-06 NOTE — ED Triage Notes (Signed)
 Right foot pain around the 2nd toe area since Saturday.  Hx of gout.  No known injury

## 2023-12-07 LAB — URIC ACID: Uric Acid: 7.9 mg/dL (ref 3.8–8.4)

## 2023-12-10 ENCOUNTER — Ambulatory Visit: Payer: Federal, State, Local not specified - PPO | Admitting: Family Medicine

## 2023-12-10 ENCOUNTER — Encounter: Payer: Self-pay | Admitting: Family Medicine

## 2023-12-10 VITALS — BP 126/76 | HR 59 | Ht 71.0 in | Wt 172.0 lb

## 2023-12-10 DIAGNOSIS — Z122 Encounter for screening for malignant neoplasm of respiratory organs: Secondary | ICD-10-CM | POA: Diagnosis not present

## 2023-12-10 DIAGNOSIS — E1100 Type 2 diabetes mellitus with hyperosmolarity without nonketotic hyperglycemic-hyperosmolar coma (NKHHC): Secondary | ICD-10-CM | POA: Diagnosis not present

## 2023-12-10 DIAGNOSIS — E782 Mixed hyperlipidemia: Secondary | ICD-10-CM | POA: Diagnosis not present

## 2023-12-10 DIAGNOSIS — E559 Vitamin D deficiency, unspecified: Secondary | ICD-10-CM | POA: Diagnosis not present

## 2023-12-10 NOTE — Patient Instructions (Addendum)
 I appreciate the opportunity to provide care to you today!    Follow up:  4 months  Labs: please stop by the lab today to get your blood drawn (CBC, CMP, TSH, Lipid profile, HgA1c, Vit D)  Lung cancer screening due : 01/18/2024    Here are some foods to avoid or reduce in your diet to help manage cholesterol levels:  Fried Foods:Deep-fried items such as french fries, fried chicken, and fried snacks are high in unhealthy fats and can raise LDL (bad) cholesterol levels. Processed Meats:Foods like bacon, sausage, hot dogs, and deli meats are often high in saturated fat and cholesterol. Full-Fat Dairy Products:Whole milk, full-fat yogurt, butter, cream, and cheese are rich in saturated fats, which can increase cholesterol levels. Baked Goods and Sweets:Pastries, cakes, cookies, and donuts often contain trans fats and added sugars, which can raise LDL cholesterol and lower HDL (good) cholesterol. Red Meat:Beef, lamb, and pork are high in saturated fat. Lean cuts or plant-based protein alternatives are better options. Lard and Shortening:Used in some baked goods, lard and shortening are high in trans fats and should be avoided. Fast Food:Many fast food items are cooked with unhealthy oils and contain high amounts of saturated and trans fats. Processed Snacks:Chips, crackers, and certain microwave popcorns can contain trans fats and high levels of unhealthy oils. Shellfish:While nutritious in other ways, some shellfish like shrimp, lobster, and crab are high in cholesterol. They should be consumed in moderation. Coconut and Palm Oils:these oils are high in saturated fat and can raise cholesterol levels when used in cooking or baking.       Please continue to a heart-healthy diet and increase your physical activities. Try to exercise for at least five days a week.    It was a pleasure to see you and I look forward to continuing to work together on your health and well-being. Please do not  hesitate to call the office if you need care or have questions about your care.  In case of emergency, please visit the Emergency Department for urgent care, or contact our clinic at [phone] to schedule an appointment. We're here to help you!   Have a wonderful day and week. With Gratitude, Gilmore Laroche MSN, FNP-BC

## 2023-12-10 NOTE — Assessment & Plan Note (Signed)
 The patient is taking glimepiride 2 mg daily for his type. He denies experiencing polyuria, polyphagia, or polydipsia. I encouraged him to decrease his intake of high-sugar foods and beverages while increasing his physical activity.  Encouraged to continue current treatment regimen Lab Results  Component Value Date   HGBA1C 6.4 (H) 08/16/2023

## 2023-12-10 NOTE — Assessment & Plan Note (Signed)
 Encouraged to increase his intake of vitamin D-rich foods such as fatty fish (e.g., salmon, mackerel, and sardines), fortified dairy products, egg yolks, and fortified cereals.
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
## 2023-12-10 NOTE — Assessment & Plan Note (Signed)
 The patient takes atorvastatin 80 mg daily and ezetimibe 10 mg daily. I recommended increasing his intake of foods rich in fruits, vegetables, and whole grains, as well as incorporating lean proteins such as chicken, fish, beans, and legumes. He should also opt for low-fat dairy products and reduce his intake of saturated fats, trans fatty acids, and cholesterol. Additionally, I encouraged him to aim for at least 30 minutes of brisk walking or other physical activities on at least five days a week.  Lab Results  Component Value Date   CHOL 202 (H) 08/16/2023   HDL 30 (L) 08/16/2023   LDLCALC 118 (H) 08/16/2023   LDLDIRECT 67.3 04/17/2021   TRIG 307 (H) 08/16/2023   CHOLHDL 6.7 (H) 08/16/2023

## 2024-02-20 ENCOUNTER — Encounter: Payer: Self-pay | Admitting: *Deleted

## 2024-03-06 ENCOUNTER — Ambulatory Visit
Admission: EM | Admit: 2024-03-06 | Discharge: 2024-03-06 | Disposition: A | Discharge status: Home / Self Care | Attending: Family Medicine | Admitting: Family Medicine

## 2024-03-06 ENCOUNTER — Encounter: Payer: Self-pay | Admitting: Emergency Medicine

## 2024-03-06 ENCOUNTER — Other Ambulatory Visit: Payer: Self-pay

## 2024-03-06 DIAGNOSIS — M109 Gout, unspecified: Secondary | ICD-10-CM | POA: Diagnosis not present

## 2024-03-06 MED ORDER — DEXAMETHASONE SODIUM PHOSPHATE 10 MG/ML IJ SOLN
10.0000 mg | Freq: Once | INTRAMUSCULAR | Status: AC
  Administered 2024-03-06: 10 mg via INTRAMUSCULAR

## 2024-03-06 NOTE — Discharge Instructions (Signed)
 I recommend a tart cherry capsule supplement to help neutralize uric acid accumulation in addition to making dietary changes.  Follow-up with primary care provider for a recheck

## 2024-03-06 NOTE — ED Triage Notes (Signed)
 Pt reports woke up this am with left ankle pain/redness and right foot second and third digit pain/tenderness. Reports hx of gout and pain is similar. Denies any known injury.
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
## 2024-03-27 ENCOUNTER — Other Ambulatory Visit: Payer: Self-pay | Admitting: Medical Genetics

## 2024-03-27 ENCOUNTER — Encounter: Payer: Self-pay | Admitting: Family Medicine

## 2024-03-31 ENCOUNTER — Other Ambulatory Visit (HOSPITAL_COMMUNITY)
Admission: RE | Admit: 2024-03-31 | Discharge: 2024-03-31 | Disposition: A | Discharge status: Home / Self Care | Payer: Self-pay | Source: Ambulatory Visit | Attending: Medical Genetics | Admitting: Medical Genetics

## 2024-03-31 NOTE — Telephone Encounter (Signed)
 Printed, email provided on sticky note, given to yosseline

## 2024-04-10 ENCOUNTER — Ambulatory Visit: Admitting: Family Medicine

## 2024-04-10 ENCOUNTER — Encounter: Payer: Self-pay | Admitting: Family Medicine

## 2024-04-10 VITALS — BP 135/77 | HR 75 | Ht 71.0 in | Wt 175.0 lb

## 2024-04-10 DIAGNOSIS — E1100 Type 2 diabetes mellitus with hyperosmolarity without nonketotic hyperglycemic-hyperosmolar coma (NKHHC): Secondary | ICD-10-CM | POA: Diagnosis not present

## 2024-04-10 DIAGNOSIS — Z7984 Long term (current) use of oral hypoglycemic drugs: Secondary | ICD-10-CM

## 2024-04-10 DIAGNOSIS — E038 Other specified hypothyroidism: Secondary | ICD-10-CM

## 2024-04-10 DIAGNOSIS — M1A0720 Idiopathic chronic gout, left ankle and foot, without tophus (tophi): Secondary | ICD-10-CM

## 2024-04-10 DIAGNOSIS — E782 Mixed hyperlipidemia: Secondary | ICD-10-CM | POA: Diagnosis not present

## 2024-04-10 DIAGNOSIS — E569 Vitamin deficiency, unspecified: Secondary | ICD-10-CM | POA: Diagnosis not present

## 2024-04-10 DIAGNOSIS — M109 Gout, unspecified: Secondary | ICD-10-CM | POA: Insufficient documentation

## 2024-04-10 DIAGNOSIS — E559 Vitamin D deficiency, unspecified: Secondary | ICD-10-CM

## 2024-04-10 DIAGNOSIS — R7301 Impaired fasting glucose: Secondary | ICD-10-CM

## 2024-04-10 MED ORDER — GLIMEPIRIDE 2 MG PO TABS: 2.0000 mg | ORAL_TABLET | Freq: Every day | ORAL | 1 refills | Status: AC

## 2024-04-10 MED ORDER — ALLOPURINOL 100 MG PO TABS: 100.0000 mg | ORAL_TABLET | Freq: Every day | ORAL | 1 refills | Status: AC

## 2024-04-10 MED ORDER — ATORVASTATIN CALCIUM 80 MG PO TABS: 80.0000 mg | ORAL_TABLET | Freq: Every day | ORAL | 1 refills | Status: AC

## 2024-04-10 MED ORDER — VITAMIN D (ERGOCALCIFEROL) 1.25 MG (50000 UNIT) PO CAPS: 50000.0000 [IU] | ORAL_CAPSULE | ORAL | 1 refills | Status: AC

## 2024-04-10 MED ORDER — EZETIMIBE 10 MG PO TABS: 10.0000 mg | ORAL_TABLET | Freq: Every day | ORAL | 1 refills | Status: AC

## 2024-04-10 NOTE — Assessment & Plan Note (Signed)
 The patient is requesting prophylactic treatment for gout. Therapy will be initiated with allopurinol  100 mg daily. Nonpharmacologic interventions include maintaining adequate hydration, limiting intake of purine-rich foods (such as red meat, shellfish, and organ meats), reducing alcohol consumption--especially beer--and avoiding sugary beverages. The patient is also encouraged to maintain a healthy weight and engage in regular physical activity.

## 2024-04-10 NOTE — Assessment & Plan Note (Signed)
 The patient is taking glimepiride 2 mg daily for his type. He denies experiencing polyuria, polyphagia, or polydipsia. I encouraged him to decrease his intake of high-sugar foods and beverages while increasing his physical activity.  Encouraged to continue current treatment regimen Lab Results  Component Value Date   HGBA1C 6.4 (H) 08/16/2023

## 2024-04-10 NOTE — Assessment & Plan Note (Signed)
 The patient takes atorvastatin 80 mg daily and ezetimibe 10 mg daily. I recommended increasing his intake of foods rich in fruits, vegetables, and whole grains, as well as incorporating lean proteins such as chicken, fish, beans, and legumes. He should also opt for low-fat dairy products and reduce his intake of saturated fats, trans fatty acids, and cholesterol. Additionally, I encouraged him to aim for at least 30 minutes of brisk walking or other physical activities on at least five days a week.  Lab Results  Component Value Date   CHOL 202 (H) 08/16/2023   HDL 30 (L) 08/16/2023   LDLCALC 118 (H) 08/16/2023   LDLDIRECT 67.3 04/17/2021   TRIG 307 (H) 08/16/2023   CHOLHDL 6.7 (H) 08/16/2023

## 2024-04-10 NOTE — Assessment & Plan Note (Signed)
 Encouraged to increase his intake of vitamin D-rich foods such as fatty fish (e.g., salmon, mackerel, and sardines), fortified dairy products, egg yolks, and fortified cereals.
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
## 2024-04-10 NOTE — Patient Instructions (Signed)
 I appreciate the opportunity to provide care to you today!    Follow up:  4 months  Labs: please stop by the lab today to get your blood drawn (CBC, CMP, TSH, Lipid profile, HgA1c, Vit D)  For a Healthier YOU, I Recommend: Reducing your intake of sugar, sodium, carbohydrates, and saturated fats. Increasing your fiber intake by incorporating more whole grains, fruits, and vegetables into your meals. Setting healthy goals with a focus on lowering your consumption of carbs, sugar, and unhealthy fats. Adding variety to your diet by including a wide range of fruits and vegetables. Cutting back on soda and limiting processed foods as much as possible. Staying active: In addition to taking your weight loss medication, aim for at least 150 minutes of moderate-intensity physical activity each week for optimal results.    Please follow up if your symptoms worsen or fail to improve.    Please continue to a heart-healthy diet and increase your physical activities. Try to exercise for at least five days a week.    It was a pleasure to see you and I look forward to continuing to work together on your health and well-being. Please do not hesitate to call the office if you need care or have questions about your care.  In case of emergency, please visit the Emergency Department for urgent care, or contact our clinic at [phone] to schedule an appointment. We're here to help you!   Have a wonderful day and week. With Gratitude, Aerilyn Slee MSN, FNP-BC

## 2024-04-11 ENCOUNTER — Ambulatory Visit: Payer: Self-pay | Admitting: Family Medicine

## 2024-04-11 LAB — CMP14+EGFR
ALT: 15 IU/L (ref 0–44)
AST: 18 IU/L (ref 0–40)
Albumin: 4.6 g/dL (ref 3.8–4.9)
Alkaline Phosphatase: 64 IU/L (ref 44–121)
BUN/Creatinine Ratio: 15 (ref 9–20)
BUN: 17 mg/dL (ref 6–24)
Bilirubin Total: 0.4 mg/dL (ref 0.0–1.2)
CO2: 21 mmol/L (ref 20–29)
Calcium: 9.5 mg/dL (ref 8.7–10.2)
Chloride: 104 mmol/L (ref 96–106)
Creatinine, Ser: 1.1 mg/dL (ref 0.76–1.27)
Globulin, Total: 2.1 g/dL (ref 1.5–4.5)
Glucose: 112 mg/dL — ABNORMAL HIGH (ref 70–99)
Potassium: 4.6 mmol/L (ref 3.5–5.2)
Sodium: 140 mmol/L (ref 134–144)
Total Protein: 6.7 g/dL (ref 6.0–8.5)
eGFR: 78 mL/min/1.73 (ref >59)

## 2024-04-11 LAB — GENECONNECT MOLECULAR SCREEN: Genetic Analysis Overall Interpretation: NEGATIVE

## 2024-04-11 LAB — CBC WITH DIFFERENTIAL/PLATELET
Basophils Absolute: 0 x10E3/uL (ref 0.0–0.2)
Basos: 1 %
EOS (ABSOLUTE): 0.3 x10E3/uL (ref 0.0–0.4)
Eos: 6 %
Hematocrit: 46.2 % (ref 37.5–51.0)
Hemoglobin: 15.5 g/dL (ref 13.0–17.7)
Immature Grans (Abs): 0 x10E3/uL (ref 0.0–0.1)
Immature Granulocytes: 0 %
Lymphocytes Absolute: 1.4 x10E3/uL (ref 0.7–3.1)
Lymphs: 29 %
MCH: 32.5 pg (ref 26.6–33.0)
MCHC: 33.5 g/dL (ref 31.5–35.7)
MCV: 97 fL (ref 79–97)
Monocytes Absolute: 0.4 x10E3/uL (ref 0.1–0.9)
Monocytes: 9 %
Neutrophils Absolute: 2.6 x10E3/uL (ref 1.4–7.0)
Neutrophils: 55 %
Platelets: 186 x10E3/uL (ref 150–450)
RBC: 4.77 x10E6/uL (ref 4.14–5.80)
RDW: 12.2 % (ref 11.6–15.4)
WBC: 4.8 x10E3/uL (ref 3.4–10.8)

## 2024-04-11 LAB — LIPID PANEL
Chol/HDL Ratio: 5.5 ratio — ABNORMAL HIGH (ref 0.0–5.0)
Cholesterol, Total: 202 mg/dL — ABNORMAL HIGH (ref 100–199)
HDL: 37 mg/dL — ABNORMAL LOW (ref >39)
LDL Chol Calc (NIH): 114 mg/dL — ABNORMAL HIGH (ref 0–99)
Triglycerides: 296 mg/dL — ABNORMAL HIGH (ref 0–149)
VLDL Cholesterol Cal: 51 mg/dL — ABNORMAL HIGH (ref 5–40)

## 2024-04-11 LAB — TSH+FREE T4
Free T4: 1.15 ng/dL (ref 0.82–1.77)
TSH: 1.12 u[IU]/mL (ref 0.450–4.500)

## 2024-04-11 LAB — VITAMIN D 25 HYDROXY (VIT D DEFICIENCY, FRACTURES): Vit D, 25-Hydroxy: 30.9 ng/mL (ref 30.0–100.0)

## 2024-04-11 LAB — HEMOGLOBIN A1C
Est. average glucose Bld gHb Est-mCnc: 120 mg/dL
Hgb A1c MFr Bld: 5.8 % — ABNORMAL HIGH (ref 4.8–5.6)

## 2024-04-23 DIAGNOSIS — Z0279 Encounter for issue of other medical certificate: Secondary | ICD-10-CM

## 2024-04-25 ENCOUNTER — Telehealth: Payer: Self-pay

## 2024-04-25 NOTE — Telephone Encounter (Signed)
 FMLA completed and faxed to [phone] with confirmation Form sent to scan Copy available in [facility] e-mail - search by patient name

## 2024-08-11 ENCOUNTER — Ambulatory Visit: Admitting: Family Medicine

## 2024-09-22 ENCOUNTER — Telehealth (INDEPENDENT_AMBULATORY_CARE_PROVIDER_SITE_OTHER): Admitting: Family Medicine

## 2024-09-22 DIAGNOSIS — R7301 Impaired fasting glucose: Secondary | ICD-10-CM

## 2024-09-22 DIAGNOSIS — E038 Other specified hypothyroidism: Secondary | ICD-10-CM

## 2024-09-22 DIAGNOSIS — E7849 Other hyperlipidemia: Secondary | ICD-10-CM

## 2024-09-22 DIAGNOSIS — E559 Vitamin D deficiency, unspecified: Secondary | ICD-10-CM | POA: Diagnosis not present

## 2024-09-22 NOTE — Progress Notes (Signed)
 "  Virtual Visit via Video Note  I connected with Nathaniel Dunlap on 09/22/24 at 11:20 AM EST by a video enabled telemedicine application and verified that I am speaking with the correct person using two identifiers.  Patient Location: Home Provider Location: Home Office  I discussed the limitations, risks, security, and privacy concerns of performing an evaluation and management service by video and the availability of in person appointments. I also discussed with the patient that there may be a patient responsible charge related to this service. The patient expressed understanding and agreed to proceed.  Subjective: PCP: Edman Meade PEDLAR, FNP  Chief Complaint  Patient presents with   Medical Management of Chronic Issues    Follow up   HPI The patient presents today for management of chronic conditions and reports no complaints or concerns at this time. He reports adherence to her treatment regimen with no side effects or adverse effects from his medications.   ROS: Per HPI Current Medications[1]  Observations/Objective: There were no vitals filed for this visit. Physical Exam Patient is well-developed, well-nourished in no acute distress.  Resting comfortably at home.  Head is normocephalic, atraumatic.  No labored breathing.  Speech is clear and coherent with logical content.  Patient is alert and oriented at baseline.   Assessment and Plan: Vitamin D  deficiency -     VITAMIN D  25 Hydroxy (Vit-D Deficiency, Fractures)  IFG (impaired fasting glucose) -     Hemoglobin A1c  TSH (thyroid-stimulating hormone deficiency) -     TSH + free T4  Other hyperlipidemia -     Lipid panel -     CMP14+EGFR -     CBC with Differential/Platelet   Encouraged the patient to continue her/his treatment regimen as prescribed. A heart-healthy diet was encouraged, along with increased physical activity as tolerated.  Follow Up Instructions: No follow-ups on file.   I discussed the  assessment and treatment plan with the patient. The patient was provided an opportunity to ask questions, and all were answered. The patient agreed with the plan and demonstrated an understanding of the instructions.   The patient was advised to call back or seek an in-person evaluation if the symptoms worsen or if the condition fails to improve as anticipated.  The above assessment and management plan was discussed with the patient. The patient verbalized understanding of and has agreed to the management plan.   Meade PEDLAR Edman, FNP     [1]  Current Outpatient Medications:    allopurinol  (ZYLOPRIM ) 100 MG tablet, Take 1 tablet (100 mg total) by mouth daily., Disp: 90 tablet, Rfl: 1   atorvastatin  (LIPITOR ) 80 MG tablet, Take 1 tablet (80 mg total) by mouth daily., Disp: 90 tablet, Rfl: 1   ezetimibe  (ZETIA ) 10 MG tablet, Take 1 tablet (10 mg total) by mouth daily., Disp: 90 tablet, Rfl: 1   glimepiride  (AMARYL ) 2 MG tablet, Take 1 tablet (2 mg total) by mouth daily before breakfast., Disp: 90 tablet, Rfl: 1   Omega-3 Fatty Acids (FISH OIL) 1000 MG CAPS, Take 1,000 mg by mouth daily., Disp: , Rfl:    predniSONE  (DELTASONE ) 20 MG tablet, Take 3 tablets (60mg ) on days 1-2. Take 2 tablets (40mg ) on days 3-4. Take 1 tablet (20mg ) on days 5-6, then stop. (Patient not taking: Reported on 12/10/2023), Disp: 12 tablet, Rfl: 0   Vitamin D , Ergocalciferol , (DRISDOL ) 1.25 MG (50000 UNIT) CAPS capsule, Take 1 capsule (50,000 Units total) by mouth every 7 (seven) days.,  Disp: 27 capsule, Rfl: 1  "

## 2024-10-10 LAB — CBC WITH DIFFERENTIAL/PLATELET
Basophils Absolute: 0.1 10*3/uL (ref 0.0–0.2)
Basos: 1 %
EOS (ABSOLUTE): 0.5 10*3/uL — ABNORMAL HIGH (ref 0.0–0.4)
Eos: 11 %
Hematocrit: 43.2 % (ref 37.5–51.0)
Hemoglobin: 14.5 g/dL (ref 13.0–17.7)
Immature Grans (Abs): 0 10*3/uL (ref 0.0–0.1)
Immature Granulocytes: 0 %
Lymphocytes Absolute: 1.4 10*3/uL (ref 0.7–3.1)
Lymphs: 33 %
MCH: 32.2 pg (ref 26.6–33.0)
MCHC: 33.6 g/dL (ref 31.5–35.7)
MCV: 96 fL (ref 79–97)
Monocytes Absolute: 0.5 10*3/uL (ref 0.1–0.9)
Monocytes: 10 %
Neutrophils Absolute: 1.9 10*3/uL (ref 1.4–7.0)
Neutrophils: 45 %
Platelets: 202 10*3/uL (ref 150–450)
RBC: 4.5 x10E6/uL (ref 4.14–5.80)
RDW: 11.9 % (ref 11.6–15.4)
WBC: 4.3 10*3/uL (ref 3.4–10.8)

## 2024-10-10 LAB — CMP14+EGFR
ALT: 26 [IU]/L (ref 0–44)
AST: 25 [IU]/L (ref 0–40)
Albumin: 4.7 g/dL (ref 3.8–4.9)
Alkaline Phosphatase: 67 [IU]/L (ref 47–123)
BUN/Creatinine Ratio: 17 (ref 9–20)
BUN: 17 mg/dL (ref 6–24)
Bilirubin Total: 0.7 mg/dL (ref 0.0–1.2)
CO2: 23 mmol/L (ref 20–29)
Calcium: 9.4 mg/dL (ref 8.7–10.2)
Chloride: 103 mmol/L (ref 96–106)
Creatinine, Ser: 1.01 mg/dL (ref 0.76–1.27)
Globulin, Total: 2.6 g/dL (ref 1.5–4.5)
Glucose: 125 mg/dL — ABNORMAL HIGH (ref 70–99)
Potassium: 4.1 mmol/L (ref 3.5–5.2)
Sodium: 141 mmol/L (ref 134–144)
Total Protein: 7.3 g/dL (ref 6.0–8.5)
eGFR: 86 mL/min/{1.73_m2}

## 2024-10-10 LAB — HEMOGLOBIN A1C
Est. average glucose Bld gHb Est-mCnc: 126 mg/dL
Hgb A1c MFr Bld: 6 % — ABNORMAL HIGH (ref 4.8–5.6)

## 2024-10-10 LAB — TSH+FREE T4
Free T4: 1.32 ng/dL (ref 0.82–1.77)
TSH: 1.53 u[IU]/mL (ref 0.450–4.500)

## 2024-10-10 LAB — LIPID PANEL
Chol/HDL Ratio: 5.6 ratio — ABNORMAL HIGH (ref 0.0–5.0)
Cholesterol, Total: 212 mg/dL — ABNORMAL HIGH (ref 100–199)
HDL: 38 mg/dL — ABNORMAL LOW
LDL Chol Calc (NIH): 141 mg/dL — ABNORMAL HIGH (ref 0–99)
Triglycerides: 183 mg/dL — ABNORMAL HIGH (ref 0–149)
VLDL Cholesterol Cal: 33 mg/dL (ref 5–40)

## 2024-10-10 LAB — VITAMIN D 25 HYDROXY (VIT D DEFICIENCY, FRACTURES): Vit D, 25-Hydroxy: 22.7 ng/mL — ABNORMAL LOW (ref 30.0–100.0)

## 2025-02-24 ENCOUNTER — Ambulatory Visit: Payer: Self-pay
